# Patient Record
Sex: Male | Born: 1990 | Race: White | Hispanic: No | Marital: Single | Smoking: Never smoker
Health system: Southern US, Community
[De-identification: ages and names within clinical notes are randomized; demographics above are authoritative.]

## PROBLEM LIST (undated history)

## (undated) ENCOUNTER — Ambulatory Visit (HOSPITAL_COMMUNITY): Admission: EM | Discharge status: Absent without leave | Payer: 59

## (undated) DIAGNOSIS — R519 Headache, unspecified: Secondary | ICD-10-CM

## (undated) DIAGNOSIS — R51 Headache: Secondary | ICD-10-CM

---

## 2016-12-02 ENCOUNTER — Emergency Department (HOSPITAL_COMMUNITY): Payer: 59

## 2016-12-02 ENCOUNTER — Emergency Department (HOSPITAL_COMMUNITY)
Admission: EM | Admit: 2016-12-02 | Discharge: 2016-12-03 | Disposition: A | Discharge status: Home / Self Care | Payer: 59 | Attending: Emergency Medicine | Admitting: Emergency Medicine

## 2016-12-02 ENCOUNTER — Encounter (HOSPITAL_COMMUNITY): Payer: Self-pay

## 2016-12-02 DIAGNOSIS — G43B0 Ophthalmoplegic migraine, not intractable: Secondary | ICD-10-CM | POA: Diagnosis not present

## 2016-12-02 DIAGNOSIS — R51 Headache: Secondary | ICD-10-CM | POA: Diagnosis present

## 2016-12-02 LAB — I-STAT CHEM 8, ED
BUN: 9 mg/dL (ref 6–20)
Calcium, Ion: 1.18 mmol/L (ref 1.15–1.40)
Chloride: 106 mmol/L (ref 101–111)
Creatinine, Ser: 1 mg/dL (ref 0.61–1.24)
Glucose, Bld: 92 mg/dL (ref 65–99)
HCT: 38 % — ABNORMAL LOW (ref 39.0–52.0)
Hemoglobin: 12.9 g/dL — ABNORMAL LOW (ref 13.0–17.0)
Potassium: 4.2 mmol/L (ref 3.5–5.1)
Sodium: 140 mmol/L (ref 135–145)
TCO2: 23 mmol/L (ref 0–100)

## 2016-12-02 MED ORDER — DEXAMETHASONE SODIUM PHOSPHATE 10 MG/ML IJ SOLN
10.0000 mg | Freq: Once | INTRAMUSCULAR | Status: AC
  Administered 2016-12-02: 10 mg via INTRAVENOUS
  Filled 2016-12-02: qty 1

## 2016-12-02 MED ORDER — SODIUM CHLORIDE 0.9 % IV BOLUS (SEPSIS)
1000.0000 mL | Freq: Once | INTRAVENOUS | Status: AC
  Administered 2016-12-02: 1000 mL via INTRAVENOUS

## 2016-12-02 MED ORDER — KETOROLAC TROMETHAMINE 30 MG/ML IJ SOLN
30.0000 mg | Freq: Once | INTRAMUSCULAR | Status: AC
  Administered 2016-12-02: 30 mg via INTRAVENOUS
  Filled 2016-12-02: qty 1

## 2016-12-02 MED ORDER — DIPHENHYDRAMINE HCL 50 MG/ML IJ SOLN
25.0000 mg | Freq: Once | INTRAMUSCULAR | Status: AC
  Administered 2016-12-02: 25 mg via INTRAVENOUS
  Filled 2016-12-02: qty 1

## 2016-12-02 MED ORDER — METOCLOPRAMIDE HCL 5 MG/ML IJ SOLN
10.0000 mg | Freq: Once | INTRAMUSCULAR | Status: AC
  Administered 2016-12-02: 10 mg via INTRAVENOUS
  Filled 2016-12-02: qty 2

## 2016-12-02 NOTE — ED Triage Notes (Signed)
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 States developed headache this am with pain over left eye. Alert and oriented, NAD

## 2016-12-02 NOTE — ED Provider Notes (Signed)
[facility] DEPT Provider Note   CSN: [id number] Arrival date & time: 12/02/16  1859     History   Chief Complaint No chief complaint on file.   HPI [name] is a 26 y.o. male.  [name] is a 26 y.o. Male who presents to the ED complaining of a headache and loss of his left eye peripheral vision today. 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 He takes nortriptyline and topiramate for his headaches and has taken this today without relief. He reports only the peripheral vision in his left eye is affected. He denies eye pain. He reports generalized headache that is worse frontal. He does wear contacts. He denies head injury. He denies falls, fevers, neck pain, neck stiffness, numbness, tingling, weakness, dizziness, eye pain, chest pain, abdominal pain, nausea, vomiting or rashes.   The history is provided by the patient and medical records. No language interpreter was used.    History reviewed. No pertinent past medical history.  There are no active problems to display for this patient.   History reviewed. No pertinent surgical history.     Home Medications    Prior to Admission medications   Medication Sig Start Date End Date Taking? Authorizing Provider  nortriptyline (PAMELOR) 10 MG capsule Take 40-60 mg by mouth every morning. 40mg  daily until December 07, 2016 Then 50mg  daily until December 14, 2016 Then 60mg  daily from then on   Yes [provider]  ranitidine (ZANTAC) 150 MG tablet Take 150 mg by mouth daily as needed for heartburn.   Yes [provider]  TOPIRAMATE PO Take by mouth.   Yes [provider]    Family History No family history on file.  Social History Social History  Substance Use Topics  . Smoking status: Never Smoker  . Smokeless tobacco: Never Used  . Alcohol use Not on file     Allergies   Codeine   Review of  Systems Review of Systems  Constitutional: Negative for chills and fever.  HENT: Negative for congestion and sore throat.   Eyes: Positive for visual disturbance. Negative for photophobia, pain and redness.  Respiratory: Negative for cough and shortness of breath.   Cardiovascular: Negative for chest pain.  Gastrointestinal: Negative for abdominal pain, diarrhea, nausea and vomiting.  Genitourinary: Negative for dysuria.  Musculoskeletal: Negative for back pain, neck pain and neck stiffness.  Skin: Negative for rash.  Neurological: Positive for light-headedness and headaches. Negative for dizziness, syncope, weakness and numbness.     Physical Exam Updated Vital Signs BP 119/81   Pulse 63   Temp 99 F (37.2 C) (Oral)   Resp 18   SpO2 98%   Physical Exam  Constitutional: He is oriented to person, place, and time. He appears well-developed and well-nourished. No distress.  Nontoxic appearing.   HENT:  Head: Normocephalic and atraumatic.  Right Ear: External ear normal.  Left Ear: External ear normal.  Mouth/Throat: Oropharynx is clear and moist. No oropharyngeal exudate.  No temporal edema or TTP. Bilateral tympanic membranes are pearly-gray without erythema or loss of landmarks.   Eyes: Conjunctivae and EOM are normal. Pupils are equal, round, and reactive to light. Right eye exhibits no discharge. Left eye exhibits no discharge.  On vision exam patient's left peripheral vision is only affected. When closing his left eye patient can see easily out of his right eye. EOMs intact.   Neck: Normal  range of motion. Neck supple. No JVD present. No tracheal deviation present.  No meningeal signs.  Cardiovascular: Normal rate, regular rhythm, normal heart sounds and intact distal pulses.  Exam reveals no gallop and no friction rub.   No murmur heard. Pulmonary/Chest: Effort normal and breath sounds normal. No stridor. No respiratory distress. He has no wheezes. He has no rales.   Abdominal: Soft. There is no tenderness.  Musculoskeletal: Normal range of motion. He exhibits no edema or tenderness.  Lymphadenopathy:    He has no cervical adenopathy.  Neurological: He is alert and oriented to person, place, and time. No cranial nerve deficit or sensory deficit. He exhibits normal muscle tone. Coordination normal.  See eye exam above. Left eye peripheral vision is the only area that seems to be affected. Colonel nerves are otherwise intact. Speech is clear and coherent. He is alert and oriented 3. No pronator drift. Finger to nose intact bilaterally. EOMs are intact. Normal gait. Strength and sensation is intact in his bilateral upper and lower extremities.  Skin: Skin is warm and dry. Capillary refill takes less than 2 seconds. No rash noted. He is not diaphoretic. No erythema. No pallor.  Psychiatric: He has a normal mood and affect. His behavior is normal.  Nursing note and vitals reviewed.    ED Treatments / Results  Labs (all labs ordered are listed, but only abnormal results are displayed) Labs Reviewed  I-STAT CHEM 8, ED - Abnormal; Notable for the following:       Result Value   Hemoglobin 12.9 (*)    HCT 38.0 (*)    All other components within normal limits    EKG  EKG Interpretation None       Radiology Ct Head Wo Contrast  Result Date: 12/02/2016 CLINICAL DATA:  Chronic headaches related to pituitary abnormality EXAM: CT HEAD WITHOUT CONTRAST TECHNIQUE: TECHNIQUE Multidetector CT imaging of the head and cervical spine was performed following the standard protocol without intravenous contrast. Multiplanar CT image reconstructions of the cervical spine were also generated. COMPARISON:  None. FINDINGS: CT HEAD FINDINGS Brain: No acute territorial infarction, or hemorrhage is seen. 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 Vascular: No hyperdense vessels.  No unexpected calcification. Skull: No fracture or  suspicious bone lesion. Sinuses/Orbits: Minimal mucosal thickening in the ethmoid sinuses. No acute orbital abnormality is seen. Other: None IMPRESSION: 1. 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 MRI would better depict cavernous sinus involvement of the mass as well as the relationship of the mass to the optic nerve. Given history of headache and left-sided eye pain, MRI with contrast recommended for further evaluation of the pituitary mass. Electronically Signed   By: Donavan Foil M.D.   On: 12/02/2016 22:51    Procedures Procedures (including critical care time)  Medications Ordered in ED Medications  metoCLOPramide (REGLAN) injection 10 mg (10 mg Intravenous Given 12/02/16 2237)  diphenhydrAMINE (BENADRYL) injection 25 mg (25 mg Intravenous Given 12/02/16 2231)  sodium chloride 0.9 % bolus 1,000 mL (0 mLs Intravenous Stopped 12/03/16 0022)  dexamethasone (DECADRON) injection 10 mg (10 mg Intravenous Given 12/02/16 2234)  ketorolac (TORADOL) 30 MG/ML injection 30 mg (30 mg Intravenous Given 12/02/16 2236)     Initial Impression / Assessment and Plan / ED Course  I have reviewed the triage vital signs and the nursing notes.  Pertinent labs & imaging results that were  available during my care of the patient were reviewed by me and considered in my medical decision making (see chart for details).    This is a 27 y.o. Male who presents to the ED complaining of a headache and loss of his left eye peripheral vision today. 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 He takes nortriptyline and topiramate for his headaches and has taken this today without relief. He reports only the peripheral vision in his left eye is affected. He denies eye pain. He reports generalized headache that is worse frontal. He does wear contacts. On exam the patient is afebrile nontoxic appearing. On neurological  exam he has loss of the left peripheral field of vision. This is unilateral. He has no other focal neurological deficits. Provided migraine cocktail and obtain head CT. I discussed this patient with neurology who agrees with plan for head CT. Suspect possible migraine. At this does not improve with migraine cocktail, would likely obtain an MRI. 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 At reevaluation patient reports his headache is improving, however his vision has still not improved. Will obtain MRI with contrast and MRV after discussion with radiology.  Later, while awaiting MRI, patient reports that his peripheral vision has returned. He reports his vision is completely back to normal and his headache is resolved. Suspect ocular migraine. Will discharge with close follow-up by [facility] neurology. Strict and specific return precautions were discussed. I advised the patient to follow-up with their primary care provider this week. I advised the patient to return to the emergency department with new or worsening symptoms or new concerns. The patient verbalized understanding and agreement with plan.    This patient was discussed with and evaluated by Dr. Betsey Holiday who agrees with assessment and plan.   Final Clinical Impressions(s) / ED Diagnoses   Final diagnoses:  Ophthalmoplegic migraine, not intractable    New Prescriptions Discharge Medication List as of 12/03/2016 12:22 AM       Waynetta Pean, PA-C 12/03/16 0042    Orpah Greek, MD 12/03/16 [phone]

## 2016-12-03 NOTE — ED Notes (Signed)
Patient able to ambulate independently  

## 2016-12-03 NOTE — ED Notes (Signed)
Patient asking to "skip the MRI" and follow up with a neurologist.  Vision reassessed, patient states vision improved in left periphery.  MD aware.

## 2017-08-07 ENCOUNTER — Emergency Department (HOSPITAL_COMMUNITY): Payer: 59

## 2017-08-07 ENCOUNTER — Inpatient Hospital Stay (HOSPITAL_COMMUNITY)
Admission: EM | Admit: 2017-08-07 | Discharge: 2017-08-12 | DRG: 615 | Disposition: A | Discharge status: Home / Self Care | Payer: 59 | Attending: Neurosurgery | Admitting: Neurosurgery

## 2017-08-07 ENCOUNTER — Encounter (HOSPITAL_COMMUNITY): Payer: Self-pay

## 2017-08-07 ENCOUNTER — Other Ambulatory Visit: Payer: Self-pay

## 2017-08-07 DIAGNOSIS — R739 Hyperglycemia, unspecified: Secondary | ICD-10-CM | POA: Diagnosis present

## 2017-08-07 DIAGNOSIS — I729 Aneurysm of unspecified site: Secondary | ICD-10-CM | POA: Diagnosis present

## 2017-08-07 DIAGNOSIS — G253 Myoclonus: Secondary | ICD-10-CM | POA: Diagnosis not present

## 2017-08-07 DIAGNOSIS — Z885 Allergy status to narcotic agent status: Secondary | ICD-10-CM | POA: Diagnosis not present

## 2017-08-07 DIAGNOSIS — G43909 Migraine, unspecified, not intractable, without status migrainosus: Secondary | ICD-10-CM | POA: Diagnosis present

## 2017-08-07 DIAGNOSIS — H53462 Homonymous bilateral field defects, left side: Secondary | ICD-10-CM | POA: Diagnosis present

## 2017-08-07 DIAGNOSIS — Z419 Encounter for procedure for purposes other than remedying health state, unspecified: Secondary | ICD-10-CM

## 2017-08-07 DIAGNOSIS — E237 Disorder of pituitary gland, unspecified: Secondary | ICD-10-CM

## 2017-08-07 DIAGNOSIS — I671 Cerebral aneurysm, nonruptured: Secondary | ICD-10-CM | POA: Diagnosis not present

## 2017-08-07 DIAGNOSIS — H5347 Heteronymous bilateral field defects: Secondary | ICD-10-CM

## 2017-08-07 DIAGNOSIS — R7989 Other specified abnormal findings of blood chemistry: Secondary | ICD-10-CM

## 2017-08-07 DIAGNOSIS — D497 Neoplasm of unspecified behavior of endocrine glands and other parts of nervous system: Secondary | ICD-10-CM

## 2017-08-07 DIAGNOSIS — D352 Benign neoplasm of pituitary gland: Secondary | ICD-10-CM | POA: Diagnosis present

## 2017-08-07 DIAGNOSIS — G43809 Other migraine, not intractable, without status migrainosus: Secondary | ICD-10-CM | POA: Diagnosis not present

## 2017-08-07 HISTORY — DX: Headache, unspecified: R51.9

## 2017-08-07 HISTORY — DX: Headache: R51

## 2017-08-07 LAB — BASIC METABOLIC PANEL
Anion gap: 12 (ref 5–15)
BUN: 8 mg/dL (ref 6–20)
CO2: 19 mmol/L — ABNORMAL LOW (ref 22–32)
Calcium: 9.4 mg/dL (ref 8.9–10.3)
Chloride: 106 mmol/L (ref 101–111)
Creatinine, Ser: 0.96 mg/dL (ref 0.61–1.24)
GFR calc Af Amer: >60 mL/min (ref >60)
GFR calc non Af Amer: >60 mL/min (ref >60)
Glucose, Bld: 191 mg/dL — ABNORMAL HIGH (ref 65–99)
Potassium: 4 mmol/L (ref 3.5–5.1)
Sodium: 137 mmol/L (ref 135–145)

## 2017-08-07 LAB — CBC WITH DIFFERENTIAL/PLATELET
Basophils Absolute: 0 10*3/uL (ref 0.0–0.1)
Basophils Relative: 0 %
Eosinophils Absolute: 0 10*3/uL (ref 0.0–0.7)
Eosinophils Relative: 0 %
HCT: 45.1 % (ref 39.0–52.0)
Hemoglobin: 15.4 g/dL (ref 13.0–17.0)
Lymphocytes Relative: 6 %
Lymphs Abs: 0.6 10*3/uL — ABNORMAL LOW (ref 0.7–4.0)
MCH: 29.5 pg (ref 26.0–34.0)
MCHC: 34.1 g/dL (ref 30.0–36.0)
MCV: 86.4 fL (ref 78.0–100.0)
Monocytes Absolute: 0.1 10*3/uL (ref 0.1–1.0)
Monocytes Relative: 1 %
Neutro Abs: 9.3 10*3/uL — ABNORMAL HIGH (ref 1.7–7.7)
Neutrophils Relative %: 93 %
Platelets: 194 10*3/uL (ref 150–400)
RBC: 5.22 MIL/uL (ref 4.22–5.81)
RDW: 12.5 % (ref 11.5–15.5)
WBC: 10 10*3/uL (ref 4.0–10.5)

## 2017-08-07 MED ORDER — DEXAMETHASONE SODIUM PHOSPHATE 10 MG/ML IJ SOLN
10.0000 mg | Freq: Once | INTRAMUSCULAR | Status: AC
  Administered 2017-08-07: 10 mg via INTRAVENOUS
  Filled 2017-08-07: qty 1

## 2017-08-07 MED ORDER — GADOBENATE DIMEGLUMINE 529 MG/ML IV SOLN
15.0000 mL | Freq: Once | INTRAVENOUS | Status: AC | PRN
  Administered 2017-08-07: 15 mL via INTRAVENOUS

## 2017-08-07 MED ORDER — DEXAMETHASONE SODIUM PHOSPHATE 10 MG/ML IJ SOLN
4.0000 mg | Freq: Three times a day (TID) | INTRAMUSCULAR | Status: DC
  Administered 2017-08-07 – 2017-08-08 (×3): 4 mg via INTRAVENOUS
  Filled 2017-08-07 (×3): qty 1

## 2017-08-07 MED ORDER — PROMETHAZINE HCL 25 MG/ML IJ SOLN
25.0000 mg | Freq: Once | INTRAMUSCULAR | Status: AC
  Administered 2017-08-07: 25 mg via INTRAVENOUS
  Filled 2017-08-07: qty 1

## 2017-08-07 MED ORDER — SODIUM CHLORIDE 0.9 % IV BOLUS (SEPSIS)
1000.0000 mL | Freq: Once | INTRAVENOUS | Status: AC
  Administered 2017-08-07: 1000 mL via INTRAVENOUS

## 2017-08-07 MED ORDER — KETOROLAC TROMETHAMINE 30 MG/ML IJ SOLN
30.0000 mg | Freq: Once | INTRAMUSCULAR | Status: AC
  Administered 2017-08-07: 30 mg via INTRAVENOUS
  Filled 2017-08-07: qty 1

## 2017-08-07 MED ORDER — METOCLOPRAMIDE HCL 5 MG/ML IJ SOLN
10.0000 mg | Freq: Once | INTRAMUSCULAR | Status: AC
  Administered 2017-08-07: 10 mg via INTRAVENOUS
  Filled 2017-08-07: qty 2

## 2017-08-07 NOTE — H&P (Signed)
[name] ZOX:[id number] DOB: 1991/03/17 DOA: 08/07/2017     PCP: none local Outpatient Specialists: none  Patient coming from:    home Lives alone,       Chief Complaint: Sudden visual field deficit  HPI: [name] is a 27 y.o. male with medical history significant of pituitary mass migraine headaches    Presented with sudden visual loss patient developed sudden onset around 6 AM of visual field loss he reportedly has known history of pituitary tumor but has not followed up with neurology. He states for the past 7 years he have had head ache behind left eye. Was told that he has pituitary tumor never had MRA. At the time it was only 71mm and he chose not to undergo resection.  Patient was told he has  migraines and had similar episode in June 2018 for which she received migraine cocktail some improvement reports associated left-sided headache tat is persistent not worse with activity or bending down not worse with cought. She reports complete loss of vision and temporal sides of his vision bilaterally. No LOC no trauma to the head no nausea vomiting Admission initially from [place] neurologist here   While in ER: His migraine pain was treated but did not seem to resolve.    ER provider discussed case with: [facility] Who recommends: MRI, MR pituitary, after a CT head  Significant initial  Findings: CT of the head showed stable pituitary mass is out acute changes. MRI/MRA was worrisome for aneurysm measuring 12 mm  arising from Covernous segment of left internal carotid artery WBC 10.0 otherwise unremarkable Given this findings  ER provider discussed case with:  Dr. Estanislado Pandy (Neurosurgery)  Who recommends: admitting patient to obtain a diagnostic Angiogram tomorrow morning nothing by mouth post night midnight in case he may need intervention.  Initiate Decadron taper with 10 mg now and 4 every 8 hour.   We'll see patient in consult in the morning      Neurology Dr. Jackelyn Knife see patient in consult in ED  IN ER:  Temp (24hrs), Avg:98.8 F (37.1 C), Min:98.8 F (37.1 C), Max:98.8 F (37.1 C)      on arrival  ED Triage Vitals  Enc Vitals Group     BP 08/07/17 1338 127/72     Pulse Rate 08/07/17 1338 60     Resp 08/07/17 1338 18     Temp 08/07/17 1338 98.8 F (37.1 C)     Temp Source 08/07/17 1338 Oral     SpO2 08/07/17 1338 100 %     Weight 08/07/17 1343 160 lb (72.6 kg)     Height --      Head Circumference --      Peak Flow --      Pain Score 08/07/17 1343 10     Pain Loc --      Pain Edu? --      Excl. in [place]? --     Latest satting 98% HR 62 BP 114/71  Following Medications were ordered in ER: Medications  dexamethasone (DECADRON) injection 4 mg (not administered)  sodium chloride 0.9 % bolus 1,000 mL (0 mLs Intravenous Stopped 08/07/17 1646)  ketorolac (TORADOL) 30 MG/ML injection 30 mg (30 mg Intravenous Given 08/07/17 1532)  metoCLOPramide (REGLAN) injection 10 mg (10 mg Intravenous Given 08/07/17 1532)  dexamethasone (DECADRON) injection 10 mg (10 mg Intravenous Given 08/07/17 1532)  promethazine (PHENERGAN) injection 25 mg (25 mg Intravenous Given 08/07/17 1833)  gadobenate dimeglumine (MULTIHANCE)  injection 15 mL (15 mLs Intravenous Contrast Given 08/07/17 1815)  dexamethasone (DECADRON) injection 10 mg (10 mg Intravenous Given 08/07/17 2005)     Hospitalist was called for admission for medical admission for 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     Review of Systems:    Pertinent positives include: Headache visual loss  Constitutional:  No weight loss, night sweats, Fevers, chills, fatigue, weight loss  HEENT:    Difficulty swallowing,Tooth/dental problems,Sore throat,  No sneezing, itching, ear ache, nasal congestion, post nasal drip,  Cardio-vascular:  No chest pain, Orthopnea, PND, anasarca, dizziness, palpitations.no Bilateral lower extremity  swelling  GI:  No heartburn, indigestion, abdominal pain, nausea, vomiting, diarrhea, change in bowel habits, loss of appetite, melena, blood in stool, hematemesis Resp:  no shortness of breath at rest. No dyspnea on exertion, No excess mucus, no productive cough, No non-productive cough, No coughing up of blood.No change in color of mucus.No wheezing. Skin:  no rash or lesions. No jaundice GU:  no dysuria, change in color of urine, no urgency or frequency. No straining to urinate.  No flank pain.  Musculoskeletal:  No joint pain or no joint swelling. No decreased range of motion. No back pain.  Psych:  No change in mood or affect. No depression or anxiety. No memory loss.  Neuro: no localizing neurological complaints, no tingling, no weakness, no double vision, no gait abnormality, no slurred speech, no confusion  As per HPI otherwise 10 point review of systems negative.   Past Medical History: History reviewed. No pertinent past medical history. History reviewed. No pertinent surgical history.   Social History:  Ambulatory  independently     reports that  has never smoked. he has never used smokeless tobacco. His alcohol and drug histories are not on file.  Allergies:   Allergies  Allergen Reactions  . Codeine Nausea And Vomiting       Family History:   Family History  Problem Relation Age of Onset  . Diabetes Mellitus I Sister   . CAD Other     Medications: Prior to Admission medications   Medication Sig Start Date End Date Taking? Authorizing Provider  nortriptyline (PAMELOR) 10 MG capsule Take 40-60 mg by mouth every morning. 40mg  daily until December 07, 2016 Then 50mg  daily until December 14, 2016 Then 60mg  daily from then on    [provider]  ranitidine (ZANTAC) 150 MG tablet Take 150 mg by mouth daily as needed for heartburn.    [provider]  TOPIRAMATE PO Take by mouth.    [provider]    Physical Exam: Patient Vitals for  the past 24 hrs:  BP Temp Temp src Pulse Resp SpO2 Weight  08/07/17 1600 114/71 - - 62 - 98 % -  08/07/17 1517 - - - 62 - 100 % -  08/07/17 1516 123/74 - - - - - -  08/07/17 1343 - - - - - - 72.6 kg (160 lb)  08/07/17 1338 127/72 98.8 F (37.1 C) Oral 60 18 100 % -    1. General:  in No Acute distress  well  -appearing 2. Psychological: Alert and   Oriented 3. Head/ENT:     Dry Mucous Membranes                          Head Non traumatic, neck supple  Normal  Dentition 4. SKIN:  decreased Skin turgor,  Skin clean Dry and intact no rash 5. Heart: Regular rate and rhythm no  Murmur, no Rub or gallop 6. Lungs:  Clear to auscultation bilaterally, no wheezes or crackles   7. Abdomen: Soft,   non-tender, Non distended bowel sounds present 8. Lower extremities: no clubbing, cyanosis, or edema 9. Neurologically   strength 5 out of 5 in all 4 extremities cranial nerves II through XII intact 10. MSK: Normal range of motion   body mass index is unknown because there is no height or weight on file.  Labs on Admission:   Labs on Admission: I have personally reviewed following labs and imaging studies  CBC: Recent Labs  Lab 08/07/17 2042  WBC 10.0  NEUTROABS 9.3*  HGB 15.4  HCT 45.1  MCV 86.4  PLT 443   Basic Metabolic Panel: No results for input(s): NA, K, CL, CO2, GLUCOSE, BUN, CREATININE, CALCIUM, MG, PHOS in the last 168 hours. GFR: CrCl cannot be calculated (Patient's most recent lab result is older than the maximum 21 days allowed.). Liver Function Tests: No results for input(s): AST, ALT, ALKPHOS, BILITOT, PROT, ALBUMIN in the last 168 hours. No results for input(s): LIPASE, AMYLASE in the last 168 hours. No results for input(s): AMMONIA in the last 168 hours. Coagulation Profile: No results for input(s): INR, PROTIME in the last 168 hours. Cardiac Enzymes: No results for input(s): CKTOTAL, CKMB, CKMBINDEX, TROPONINI in the last 168 hours. BNP  (last 3 results) No results for input(s): PROBNP in the last 8760 hours. HbA1C: No results for input(s): HGBA1C in the last 72 hours. CBG: No results for input(s): GLUCAP in the last 168 hours. Lipid Profile: No results for input(s): CHOL, HDL, LDLCALC, TRIG, CHOLHDL, LDLDIRECT in the last 72 hours. Thyroid Function Tests: No results for input(s): TSH, T4TOTAL, FREET4, T3FREE, THYROIDAB in the last 72 hours. Anemia Panel: No results for input(s): VITAMINB12, FOLATE, FERRITIN, TIBC, IRON, RETICCTPCT in the last 72 hours. Urine analysis: No results found for: COLORURINE, APPEARANCEUR, LABSPEC, PHURINE, GLUCOSEU, HGBUR, BILIRUBINUR, KETONESUR, PROTEINUR, UROBILINOGEN, NITRITE, LEUKOCYTESUR Sepsis Labs: @LABRCNTIP (procalcitonin:4,lacticidven:4) )No results found for this or any previous visit (from the past 240 hour(s)).     UA not ordered  No results found for: HGBA1C  CrCl cannot be calculated (Patient's most recent lab result is older than the maximum 21 days allowed.).  BNP (last 3 results) No results for input(s): PROBNP in the last 8760 hours.   ECG REPORT Not obtained  Filed Weights   08/07/17 1343  Weight: 72.6 kg (160 lb)     Cultures: No results found for: SDES, SPECREQUEST, CULT, REPTSTATUS   Radiological Exams on Admission: Ct Head Wo Contrast  Result Date: 08/07/2017 CLINICAL DATA:  Headache for 3 hours. EXAM: CT HEAD WITHOUT CONTRAST TECHNIQUE: Contiguous axial images were obtained from the base of the skull through the vertex without intravenous contrast. COMPARISON:  12/02/2016 FINDINGS: Brain: The ventricles are normal in size and configuration. No extra-axial fluid collections are identified. The gray-white differentiation is maintained. No CT findings for acute hemispheric infarction or intracranial hemorrhage. There is a stable pituitary lesion on the left side measuring a maximum of 12 mm. No change when compared to the prior CT scan. The brainstem and  cerebellum are normal. Vascular: No hyperdense vessels or obvious aneurysm. Skull: No acute skull fracture.  No bone lesion. Sinuses/Orbits: The paranasal sinuses and mastoid air cells are clear. The globes are intact. Other: No scalp  lesions, laceration or hematoma. 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 No new/acute intracranial findings. Electronically Signed   By: Marijo Sanes M.D.   On: 08/07/2017 17:16   Mr Angiogram Head Wo Contrast  Result Date: 08/07/2017 CLINICAL DATA:  Headaches with reported bitemporal visual loss. Previous history of severe headaches. EXAM: MRI HEAD WITHOUT AND WITH CONTRAST MRA HEAD WITHOUT CONTRAST TECHNIQUE: Multiplanar, multiecho pulse sequences of the brain and surrounding structures were obtained without and with intravenous contrast. Angiographic images of the head were obtained using MRA technique without contrast. CONTRAST:  73mL MULTIHANCE GADOBENATE DIMEGLUMINE 529 MG/ML IV SOLN COMPARISON:  CT head 08/07/2017.  CT head 12/02/2016. FINDINGS: MRI HEAD FINDINGS Brain: No acute stroke, acute hemorrhage, intra-axial mass lesion, hydrocephalus, or extra-axial fluid. Normal for age cerebral volume. No white matter disease. 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 There is slight heterogeneity of signal within the lesion on T1 and FLAIR imaging, consistent with swirling blood. The lesion is roughly spherical, 12 mm diameter. The lesion does not enhance in conjunction with pituitary. Pituitary stalk is deviated LEFT-to-RIGHT. The lesion is outside the pituitary gland, but does accumulate contrast on post infusion imaging. The lesion does demonstrate mild mass effect on the pre chiasmatic intracranial optic nerves but the chiasm appears normally located, and surrounded by CSF. Vascular: Carotid, basilar, vertebral flow voids are preserved. The intrasellar structure  appears to have an arterial signature, with flow void similar to the carotid artery. Skull and upper cervical spine: Normal marrow signal. Sinuses/Orbits: Negative. Other: Compared with prior CT, similar appearance. MRA HEAD FINDINGS The internal carotid arteries are widely patent. The basilar artery is widely patent with vertebrals codominant. There is no intracranial stenosis. There is T1 shortening associated with abnormal flow related enhancement of an intrasellar 12 mm aneurysm arising from the superior cavernous segment of the LEFT internal carotid artery. There appears to have a narrow neck based on examination is series 12, image 60 axial source image. 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 The pituitary gland is displaced LEFT-to-RIGHT, in the stalk is stretched over the dome of the aneurysm. There is no intrinsic pituitary tumor. Mild mass effect on the pre chiasmatic intracranial optic nerves, but the chiasm appears normally located. Neuro-Interventional Radiology consultation is suggested to evaluate the appropriateness of potential treatment. Non-emergent evaluation can be arranged by calling [phone] during usual hours. Emergency evaluation can be requested by paging [phone]. Electronically Signed   By: Staci Righter M.D.   On: 08/07/2017 18:50   Mr Jeri Cos And Wo Contrast  Result Date: 08/07/2017 CLINICAL DATA:  Headaches with reported bitemporal visual loss. Previous history of severe headaches. EXAM: MRI HEAD WITHOUT AND WITH CONTRAST MRA HEAD WITHOUT CONTRAST TECHNIQUE: Multiplanar, multiecho pulse sequences of the brain and surrounding structures were obtained without and with intravenous contrast. Angiographic images of the head were obtained using MRA technique without contrast. CONTRAST:  24mL MULTIHANCE GADOBENATE DIMEGLUMINE 529 MG/ML IV SOLN COMPARISON:  CT head 08/07/2017.  CT head 12/02/2016.  FINDINGS: MRI HEAD FINDINGS Brain: No acute stroke, acute hemorrhage, intra-axial mass lesion, hydrocephalus, or extra-axial fluid. Normal for age cerebral volume. No white matter disease. 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 There is slight heterogeneity of signal  within the lesion on T1 and FLAIR imaging, consistent with swirling blood. The lesion is roughly spherical, 12 mm diameter. The lesion does not enhance in conjunction with pituitary. Pituitary stalk is deviated LEFT-to-RIGHT. The lesion is outside the pituitary gland, but does accumulate contrast on post infusion imaging. The lesion does demonstrate mild mass effect on the pre chiasmatic intracranial optic nerves but the chiasm appears normally located, and surrounded by CSF. Vascular: Carotid, basilar, vertebral flow voids are preserved. The intrasellar structure appears to have an arterial signature, with flow void similar to the carotid artery. Skull and upper cervical spine: Normal marrow signal. Sinuses/Orbits: Negative. Other: Compared with prior CT, similar appearance. MRA HEAD FINDINGS The internal carotid arteries are widely patent. The basilar artery is widely patent with vertebrals codominant. There is no intracranial stenosis. There is T1 shortening associated with abnormal flow related enhancement of an intrasellar 12 mm aneurysm arising from the superior cavernous segment of the LEFT internal carotid artery. There appears to have a narrow neck based on examination is series 12, image 60 axial source image. 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 The pituitary gland is displaced LEFT-to-RIGHT, in the stalk is stretched over the dome of the aneurysm. There is no intrinsic pituitary tumor. Mild mass effect on the pre chiasmatic intracranial optic nerves, but the chiasm  appears normally located. Neuro-Interventional Radiology consultation is suggested to evaluate the appropriateness of potential treatment. Non-emergent evaluation can be arranged by calling [phone] during usual hours. Emergency evaluation can be requested by paging [phone]. Electronically Signed   By: Staci Righter M.D.   On: 08/07/2017 18:50    Chart has been reviewed    Assessment/Plan  27 y.o. male with medical history significant of pituitary mass migraine headaches  Admitted for 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  Present on Admission: . 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  Plan for angiogram in a.m. and then probable repair acute temporal  visual loss - likely due to optic chiasm impingement, possible mild monitor neuro checks every 2 hours and stepdown if there is any acute change she will need emergent intervention  Other plan as per orders.  DVT prophylaxis:  SCD   Code Status:  FULL CODE  as per patient    Family Communication:   Family not at  Bedside     Disposition Plan:    To home once workup is complete and patient is stable       Consults called: Neurology, neurosurgery    Admission status:  inpatient      Level of care        SDU      I have spent a total of 56 min on this admission   Aleasha Fregeau 08/07/2017, 9:56 PM    [facility]  Pager [phone]   after 2 AM please page floor coverage PA If 7AM-7PM, please contact the day team taking care of the patient  [email or web address]  Password [email or web address]

## 2017-08-07 NOTE — ED Provider Notes (Cosign Needed)
[facility] EMERGENCY DEPARTMENT Provider Note   CSN: [id number] Arrival date & time: 08/07/17  1233     History   Chief Complaint Chief Complaint  Patient presents with  . Migraine    HPI [name] is a 27 y.o. male who presents today for evaluation of sudden onset visual field deficits.  He reports that it began approximately 3 hours ago.  He has a known pituitary tumor and has not been following up with neurology recently.  He reports that he had similar symptoms in June 2018 that resolved after being treated with a migraine cocktail, however he reports that this is significantly different in that it feels worse and is more painful.  He reports this is mostly on the left side of his head.   He reports that that pain felt different than this one does.  He denies trauma other than his dog playfully hitting him in the face this morning.  He denies blood thinners.  He denies nausea or vomiting.    His visual field cut is the temporal sides of his vision and he reports it is equal bilaterally.  His vision is fully gone in the affected area.    HPI  History reviewed. No pertinent past medical history.  There are no active problems to display for this patient.   History reviewed. No pertinent surgical history.     Home Medications    Prior to Admission medications   Medication Sig Start Date End Date Taking? Authorizing Provider  nortriptyline (PAMELOR) 10 MG capsule Take 40-60 mg by mouth every morning. 40mg  daily until December 07, 2016 Then 50mg  daily until December 14, 2016 Then 60mg  daily from then on    [provider]  ranitidine (ZANTAC) 150 MG tablet Take 150 mg by mouth daily as needed for heartburn.    [provider]  TOPIRAMATE PO Take by mouth.    [provider]    Family History No family history on file.  Social History Social History   Tobacco Use  . Smoking status: Never Smoker  . Smokeless tobacco: Never  Used  Substance Use Topics  . Alcohol use: Not on file  . Drug use: Not on file     Allergies   Codeine   Review of Systems Review of Systems  Constitutional: Negative for chills and fever.  HENT: Negative for ear pain and sore throat.   Eyes: Positive for visual disturbance. Negative for pain.  Respiratory: Negative for cough and shortness of breath.   Cardiovascular: Negative for chest pain and palpitations.  Gastrointestinal: Negative for abdominal pain and vomiting.  Genitourinary: Negative for dysuria and hematuria.  Musculoskeletal: Negative for arthralgias and back pain.  Skin: Negative for color change and rash.  Neurological: Positive for headaches. Negative for dizziness, seizures and syncope.  All other systems reviewed and are negative.    Physical Exam Updated Vital Signs BP 114/71   Pulse 62   Temp 98.8 F (37.1 C) (Oral)   Resp 18   Wt 72.6 kg (160 lb)   SpO2 98%   Physical Exam  Constitutional: He is oriented to person, place, and time. He appears well-developed and well-nourished.  HENT:  Head: Normocephalic and atraumatic.  Eyes: Conjunctivae and EOM are normal. Pupils are equal, round, and reactive to light.  Patient is unable to see with his peripheral vision in bilateral eyes.    Neck: Neck supple.  Cardiovascular: Normal rate and regular rhythm.  No murmur  heard. Pulmonary/Chest: Effort normal and breath sounds normal. No respiratory distress.  Abdominal: Soft. There is no tenderness.  Musculoskeletal: He exhibits no edema.  Neurological: He is alert and oriented to person, place, and time. GCS eye subscore is 4. GCS verbal subscore is 5. GCS motor subscore is 6.  Mental Status:  Alert, oriented, thought content appropriate, able to give a coherent history. Speech fluent without evidence of aphasia. Able to follow 2 step commands without difficulty.  Cranial Nerves:  II:  Patient is missing the vision in his temporal side vision.   pupils  equal, round, reactive to light.   III,IV, VI: ptosis not present, extra-ocular motions intact bilaterally  V,VII: smile symmetric, facial light touch sensation equal VIII: hearing grossly normal to voice  X: uvula elevates symmetrically  XI: bilateral shoulder shrug symmetric and strong XII: midline tongue extension without fassiculations Motor:  Normal tone. 5/5 in upper and lower extremities bilaterally including strong and equal grip strength and dorsiflexion/plantar flexion Cerebellar: normal finger-to-nose with bilateral upper extremities Gait: normal gait and balance CV: distal pulses palpable throughout    Skin: Skin is warm and dry.  Psychiatric: He has a normal mood and affect.  Nursing note and vitals reviewed.    ED Treatments / Results  Labs (all labs ordered are listed, but only abnormal results are displayed) Labs Reviewed  BASIC METABOLIC PANEL  CBC    EKG  EKG Interpretation None       Radiology Ct Head Wo Contrast  Result Date: 08/07/2017 CLINICAL DATA:  Headache for 3 hours. EXAM: CT HEAD WITHOUT CONTRAST TECHNIQUE: Contiguous axial images were obtained from the base of the skull through the vertex without intravenous contrast. COMPARISON:  12/02/2016 FINDINGS: Brain: The ventricles are normal in size and configuration. No extra-axial fluid collections are identified. The gray-white differentiation is maintained. No CT findings for acute hemispheric infarction or intracranial hemorrhage. There is a stable pituitary lesion on the left side measuring a maximum of 12 mm. No change when compared to the prior CT scan. The brainstem and cerebellum are normal. Vascular: No hyperdense vessels or obvious aneurysm. Skull: No acute skull fracture.  No bone lesion. Sinuses/Orbits: The paranasal sinuses and mastoid air cells are clear. The globes are intact. Other: No scalp lesions, laceration or hematoma. 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 No  new/acute intracranial findings. Electronically Signed   By: Marijo Sanes M.D.   On: 08/07/2017 17:16    Procedures Procedures (including critical care time)  Medications Ordered in ED Medications  promethazine (PHENERGAN) injection 25 mg (not administered)  sodium chloride 0.9 % bolus 1,000 mL (0 mLs Intravenous Stopped 08/07/17 1646)  ketorolac (TORADOL) 30 MG/ML injection 30 mg (30 mg Intravenous Given 08/07/17 1532)  metoCLOPramide (REGLAN) injection 10 mg (10 mg Intravenous Given 08/07/17 1532)  dexamethasone (DECADRON) injection 10 mg (10 mg Intravenous Given 08/07/17 1532)     Initial Impression / Assessment and Plan / ED Course  I have reviewed the triage vital signs and the nursing notes.  Pertinent labs & imaging results that were available during my care of the patient were reviewed by me and considered in my medical decision making (see chart for details).  Clinical Course as of Aug 07 1730  Wed Aug 07, 2017  1549 Paged Dr. Cheral Marker neurology  [EH]  [id number] year old male recently moved here from [place] complaining of worsening of his chronic headache now associated with some visual field cuts.  Patient has  a history of a pituitary tumor, but has actually had blood in the past.  He is here with this visual symptoms that he has had in the past.  He has no numbness or weakness.  Neurology was consulted and they are recommending MRI, MR pituitary, after a CT head.  [MB]    Clinical Course User Index [EH] Lorin Glass, PA-C [MB] Hayden Rasmussen, MD   Tamala Fothergill presents today for evaluation of sudden onset of loss of temporal side vision with headache.  He has a known mass on his pituitary has had this happen once before and it resolved with migraine cocktail.  On neuro exam he is unable to see objects in his temporal visual fields, otherwise is neuro intact.  He was treated with fluids, Toradol, Benadryl, and Reglan which brought his pain from a 10 to a 9.   Orders given for IV Phenergan.  CT head obtained, showing stable pituitary mass without acute deficits.  MR brain with contrast and pituitary was ordered.  At shift change care was transferred to [facility] who will follow pending studies, re-evaulate and determine disposition.     Final Clinical Impressions(s) / ED Diagnoses   Final diagnoses:  None    ED Discharge Orders    None       Lorin Glass, [place] 08/07/17 1732

## 2017-08-07 NOTE — ED Provider Notes (Signed)
  Patient placed in Quick Look pathway, seen and evaluated for chief complaint of headache starting today. He reports loss of peripheral vision. This is the same symptoms he had last June which resolved with headache cocktail. He has not followed up with neurology. .  Pertinent H&P findings include Speech is clear and coherent, non-toxic appearing, no respiratory distress.  A&O x 3.   Based on initial evaluation, labs are not indicated and radiology studies are not indicated.  Patient counseled on process, plan, and necessity for staying for completing the evaluation.  Vitals:   08/07/17 1338 08/07/17 1343  BP: 127/72   Pulse: 60   Resp: 18   Temp: 98.8 F (37.1 C)   TempSrc: Oral   SpO2: 100%   Weight:  72.6 kg (160 lb)       Waynetta Pean, PA-C 08/07/17 1346    Lajean Saver, MD 08/07/17 1438

## 2017-08-07 NOTE — ED Notes (Signed)
No decrease in Pt peripheral field of vision.

## 2017-08-07 NOTE — ED Triage Notes (Signed)
Pt presents to the ed with complaints of headache that has been going on for 3 hours. States that he has lost his peripheral vision when it got really bad today. Pt has no focal neuro deficits, alert oriented and ambulatory. PA Will at bedside.

## 2017-08-07 NOTE — Consult Note (Signed)
Neurology Consultation Reason for Consult: Headache, aneurysm  Referring Physician: Roel Cluck   History is obtained from:Patient   HPI: [name] is a 27 y.o. male past medical history of headaches thought to be migraine, pituitary mass into 7 years presents with sudden vision loss and headache.  Patient states that he was diagnosed about 7 years ago in [place] to have had a pituitary mass, however was told if 7 mm and did not have it resected. He has not  followed up with a neurologist after moving to [facility] about a year ago. He has constant retro-orbital headaches which was diagnosed as migraine currently on no medications. Previously on amitriptyline. He also has had visual symptoms in the left eye for a while, but however today around 11 00 he noticed his vision was worse as well as he could not see from his right eye. 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 The patient received migraine cocktail and Decadron in the ER after consultation with interventional  neuroradiology is admitted for diagnostic angiogram. Neurology was consulted for recommendations.   ROS: A 14 point ROS was performed and is negative except as noted in the HPI  History reviewed. No pertinent past medical history.   Family History  Problem Relation Age of Onset  . Diabetes Mellitus I Sister   . CAD Other      Social History:  reports that  has never smoked. he has never used smokeless tobacco. He reports that he drinks alcohol. His drug history is not on file.   Exam: Current vital signs: BP 115/72   Pulse 68   Temp 98.8 F (37.1 C) (Oral)   Resp 20   Wt 72.6 kg (160 lb)   SpO2 97%  Vital signs in last 24 hours: Temp:  [98.8 F (37.1 C)] 98.8 F (37.1 C) (02/13 1338) Pulse Rate:  [60-68] 68 (02/13 2218) Resp:  [18-20]  20 (02/13 2218) BP: (114-127)/(71-74) 115/72 (02/13 2218) SpO2:  [97 %-100 %] 97 % (02/13 2218) Weight:  [72.6 kg (160 lb)] 72.6 kg (160 lb) (02/13 1343)   Physical Exam  Constitutional: Appears well-developed and well-nourished.  Psych: Affect appropriate to situation Eyes: No scleral injection HENT: No OP obstrucion Head: Normocephalic.  Cardiovascular: Normal rate and regular rhythm.  Respiratory: Effort normal, non-labored breathing GI: Soft.  No distension. There is no tenderness.  Skin: WDI  Neuro: Mental Status: Patient is awake, alert, oriented to person, place, month, year, and situation. Patient is able to give a clear and coherent history. No signs of aphasia or neglect Cranial Nerves: II: Visual Fields: Left Homonymous hemianopsia. Pupils are equal, round, and reactive to light.   III,IV, VI: EOMI without ptosis or diploplia.  V: Facial sensation is symmetric to temperature VII: Facial movement is symmetric.  VIII: hearing is intact to voice X: Uvula elevates symmetrically XI: Shoulder shrug is symmetric. XII: tongue is midline without atrophy or fasciculations.  Motor: Tone is normal. Bulk is normal. 5/5 strength was present in all four extremities.  Sensory: Sensation is symmetric to light touch and temperature in the arms and legs. Deep Tendon Reflexes: 2+ and symmetric in the biceps and patellae.  Plantars: Toes are downgoing bilaterally.  Cerebellar: FNF and HKS are intact bilaterally   I have reviewed labs in epic and the results pertinent to this  consultation are: Within normal limits   I have reviewed the images obtained: MRI brain and MRA head, I ordered coagulation parameters   ASSESSMENT AND PLAN  [name] is a 27 y.o. male past medical history of headaches thought to be migraine, pituitary mass into 7 years presents with sudden vision loss and headache. On examination found to have left, homonymous hemianopsia with MR imaging showing  a large left ICA aneurysm she gives the pituitary gland. I reviewed the images and agree that this is likely an aneurysm and not pituitary tumor. He is made nothing by mouth for diagnostic cerebral angiogram tomorrow morning.   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

## 2017-08-07 NOTE — ED Provider Notes (Signed)
Care assumed from  Wyn Quaker, PA-C at shift change with MRI pending.   In brief, this patient is a 27 y.o. M who presents for evaluation of headache and bitemporal hemianopsia that began today. Patient reports that he has a history of a pituitary mass that was diagnosed several years ago and has been stable since. He has not followed up outpatient neuro since evaluation of symptoms.   Discussed with neuro after CT head and recommended MRI for further evaluation.   PLAN: Follow-up on MRI. If MRI is unremarkable and patient's vision is normal, discharge with outpatient neuro. If MRI is negative and patient is symptomatic, get formal neuro evaluation.   MDM:  MRI reviewed. There is concern of a 12 mm intrasellar saccular aneurysm arising from the superior cavernous segment left internal carotid artery with mild mass effect on the pre-chiasmatic intracranial optic nerves.   Discussed patient with Dr. Estanislado Pandy (Neurosurgery) regarding MRI findings. He recommends admitting patient to obtain a diagnostic Angiogram tomorrow morning. If definitive, neurosurgery will plan to treat. Meanwhile recommends making patient NPO after midnight. Recommends one time dose of 10 mg decadron here in the ED and 4mg  decadron q8 hours. If patient's vision symptoms are stable and remain the same, plan for diagnostic angiogram in the morning. If patient's vision worsens plan for emergent consult to neurosurgery for further evaluation.   Discussed patient with Dr. Lorraine Lax (Neuro). Agrees with plan for admission. Will consult.   Discussed patient with Dr. Roel Cluck (hosiptalist). Will plan to admit.   1. Bilateral hemianopia   2. Aneurysm [facility])        Desma Mcgregor 08/07/17 2235    Hayden Rasmussen, MD 08/08/17 1135

## 2017-08-08 ENCOUNTER — Other Ambulatory Visit: Payer: Self-pay | Admitting: Neurosurgery

## 2017-08-08 ENCOUNTER — Encounter (HOSPITAL_COMMUNITY): Payer: Self-pay

## 2017-08-08 ENCOUNTER — Other Ambulatory Visit: Payer: Self-pay

## 2017-08-08 ENCOUNTER — Inpatient Hospital Stay (HOSPITAL_COMMUNITY): Payer: 59

## 2017-08-08 DIAGNOSIS — R739 Hyperglycemia, unspecified: Secondary | ICD-10-CM

## 2017-08-08 DIAGNOSIS — R7989 Other specified abnormal findings of blood chemistry: Secondary | ICD-10-CM

## 2017-08-08 DIAGNOSIS — D497 Neoplasm of unspecified behavior of endocrine glands and other parts of nervous system: Secondary | ICD-10-CM

## 2017-08-08 DIAGNOSIS — I729 Aneurysm of unspecified site: Secondary | ICD-10-CM

## 2017-08-08 DIAGNOSIS — G43809 Other migraine, not intractable, without status migrainosus: Secondary | ICD-10-CM

## 2017-08-08 DIAGNOSIS — G43909 Migraine, unspecified, not intractable, without status migrainosus: Secondary | ICD-10-CM | POA: Diagnosis present

## 2017-08-08 HISTORY — PX: IR ANGIO INTRA EXTRACRAN SEL COM CAROTID INNOMINATE BILAT MOD SED: IMG5360

## 2017-08-08 HISTORY — PX: IR ANGIO VERTEBRAL SEL VERTEBRAL BILAT MOD SED: IMG5369

## 2017-08-08 LAB — COMPREHENSIVE METABOLIC PANEL
ALT: 16 U/L — ABNORMAL LOW (ref 17–63)
AST: 20 U/L (ref 15–41)
Albumin: 4.2 g/dL (ref 3.5–5.0)
Alkaline Phosphatase: 51 U/L (ref 38–126)
Anion gap: 10 (ref 5–15)
BUN: 7 mg/dL (ref 6–20)
CO2: 22 mmol/L (ref 22–32)
Calcium: 9.6 mg/dL (ref 8.9–10.3)
Chloride: 109 mmol/L (ref 101–111)
Creatinine, Ser: 0.93 mg/dL (ref 0.61–1.24)
GFR calc Af Amer: >60 mL/min (ref >60)
GFR calc non Af Amer: >60 mL/min (ref >60)
Glucose, Bld: 188 mg/dL — ABNORMAL HIGH (ref 65–99)
Potassium: 4.2 mmol/L (ref 3.5–5.1)
Sodium: 141 mmol/L (ref 135–145)
Total Bilirubin: 0.7 mg/dL (ref 0.3–1.2)
Total Protein: 7 g/dL (ref 6.5–8.1)

## 2017-08-08 LAB — PROTIME-INR
INR: 1.03
Prothrombin Time: 13.4 seconds (ref 11.4–15.2)

## 2017-08-08 LAB — CBC
HCT: 44.4 % (ref 39.0–52.0)
Hemoglobin: 15.1 g/dL (ref 13.0–17.0)
MCH: 29.2 pg (ref 26.0–34.0)
MCHC: 34 g/dL (ref 30.0–36.0)
MCV: 85.7 fL (ref 78.0–100.0)
Platelets: 235 10*3/uL (ref 150–400)
RBC: 5.18 MIL/uL (ref 4.22–5.81)
RDW: 12.2 % (ref 11.5–15.5)
WBC: 6.7 10*3/uL (ref 4.0–10.5)

## 2017-08-08 LAB — HIV ANTIBODY (ROUTINE TESTING W REFLEX): HIV Screen 4th Generation wRfx: NONREACTIVE

## 2017-08-08 LAB — MRSA PCR SCREENING: MRSA by PCR: NEGATIVE

## 2017-08-08 LAB — T4, FREE: Free T4: 1.2 ng/dL — ABNORMAL HIGH (ref 0.61–1.12)

## 2017-08-08 LAB — PHOSPHORUS: Phosphorus: 2.7 mg/dL (ref 2.5–4.6)

## 2017-08-08 LAB — MAGNESIUM: Magnesium: 2.1 mg/dL (ref 1.7–2.4)

## 2017-08-08 LAB — HEMOGLOBIN A1C
Hgb A1c MFr Bld: 4.8 % (ref 4.8–5.6)
Mean Plasma Glucose: 91.06 mg/dL

## 2017-08-08 LAB — TSH: TSH: 0.17 u[IU]/mL — ABNORMAL LOW (ref 0.350–4.500)

## 2017-08-08 LAB — APTT: aPTT: 29 seconds (ref 24–36)

## 2017-08-08 MED ORDER — SODIUM CHLORIDE 0.9% FLUSH
3.0000 mL | Freq: Two times a day (BID) | INTRAVENOUS | Status: DC
  Administered 2017-08-08 – 2017-08-12 (×8): 3 mL via INTRAVENOUS

## 2017-08-08 MED ORDER — ONDANSETRON HCL 4 MG PO TABS: 4.0000 mg | ORAL_TABLET | Freq: Four times a day (QID) | ORAL | Status: DC | PRN

## 2017-08-08 MED ORDER — HEPARIN SODIUM (PORCINE) 1000 UNIT/ML IJ SOLN
INTRAMUSCULAR | Status: AC | PRN
  Administered 2017-08-08: 1000 [IU] via INTRAVENOUS

## 2017-08-08 MED ORDER — SODIUM CHLORIDE 0.9% FLUSH: 3.0000 mL | INTRAVENOUS | Status: DC | PRN

## 2017-08-08 MED ORDER — HEPARIN SODIUM (PORCINE) 1000 UNIT/ML IJ SOLN
INTRAMUSCULAR | Status: AC
  Filled 2017-08-08: qty 2

## 2017-08-08 MED ORDER — IOPAMIDOL (ISOVUE-300) INJECTION 61%
INTRAVENOUS | Status: AC
  Filled 2017-08-08: qty 50

## 2017-08-08 MED ORDER — ONDANSETRON HCL 4 MG/2ML IJ SOLN
4.0000 mg | Freq: Four times a day (QID) | INTRAMUSCULAR | Status: DC | PRN
  Administered 2017-08-09: 4 mg via INTRAVENOUS

## 2017-08-08 MED ORDER — DEXAMETHASONE SODIUM PHOSPHATE 10 MG/ML IJ SOLN
10.0000 mg | Freq: Four times a day (QID) | INTRAMUSCULAR | Status: DC
  Administered 2017-08-08 – 2017-08-09 (×4): 10 mg via INTRAVENOUS
  Filled 2017-08-08 (×4): qty 1

## 2017-08-08 MED ORDER — FENTANYL CITRATE (PF) 100 MCG/2ML IJ SOLN
INTRAMUSCULAR | Status: AC | PRN
  Administered 2017-08-08 (×2): 25 ug via INTRAVENOUS

## 2017-08-08 MED ORDER — HYDROCODONE-ACETAMINOPHEN 5-325 MG PO TABS
1.0000 | ORAL_TABLET | ORAL | Status: DC | PRN
  Administered 2017-08-08 (×2): 1 via ORAL
  Administered 2017-08-08: 2 via ORAL
  Administered 2017-08-08: 1 via ORAL
  Administered 2017-08-09: 2 via ORAL
  Filled 2017-08-08: qty 2
  Filled 2017-08-08: qty 1
  Filled 2017-08-08: qty 2
  Filled 2017-08-08 (×2): qty 1

## 2017-08-08 MED ORDER — IOPAMIDOL (ISOVUE-300) INJECTION 61%
INTRAVENOUS | Status: AC
  Administered 2017-08-08: 60 mL
  Filled 2017-08-08: qty 150

## 2017-08-08 MED ORDER — LORAZEPAM 1 MG PO TABS
0.5000 mg | ORAL_TABLET | Freq: Four times a day (QID) | ORAL | Status: DC | PRN
  Administered 2017-08-08 – 2017-08-09 (×3): 0.5 mg via ORAL
  Filled 2017-08-08 (×3): qty 1

## 2017-08-08 MED ORDER — SODIUM CHLORIDE 0.9 % IV SOLN
INTRAVENOUS | Status: DC
  Administered 2017-08-08 – 2017-08-09 (×2): via INTRAVENOUS

## 2017-08-08 MED ORDER — LIDOCAINE HCL (PF) 1 % IJ SOLN
INTRAMUSCULAR | Status: AC | PRN
  Administered 2017-08-08: 10 mL

## 2017-08-08 MED ORDER — SODIUM CHLORIDE 0.9 % IV SOLN: 250.0000 mL | INTRAVENOUS | Status: DC | PRN

## 2017-08-08 MED ORDER — MIDAZOLAM HCL 2 MG/2ML IJ SOLN
INTRAMUSCULAR | Status: AC | PRN
  Administered 2017-08-08: 1 mg via INTRAVENOUS

## 2017-08-08 MED ORDER — SODIUM CHLORIDE 0.9 % IV SOLN
INTRAVENOUS | Status: AC | PRN
  Administered 2017-08-08: 10 mL/h via INTRAVENOUS

## 2017-08-08 MED ORDER — FENTANYL CITRATE (PF) 100 MCG/2ML IJ SOLN
INTRAMUSCULAR | Status: AC
  Filled 2017-08-08: qty 2

## 2017-08-08 MED ORDER — MIDAZOLAM HCL 2 MG/2ML IJ SOLN
INTRAMUSCULAR | Status: AC
  Filled 2017-08-08: qty 2

## 2017-08-08 MED ORDER — ACETAMINOPHEN 325 MG PO TABS: 650.0000 mg | ORAL_TABLET | Freq: Four times a day (QID) | ORAL | Status: DC | PRN

## 2017-08-08 MED ORDER — ACETAMINOPHEN 650 MG RE SUPP: 650.0000 mg | Freq: Four times a day (QID) | RECTAL | Status: DC | PRN

## 2017-08-08 MED ORDER — LIDOCAINE HCL (PF) 1 % IJ SOLN
INTRAMUSCULAR | Status: AC
  Filled 2017-08-08: qty 30

## 2017-08-08 NOTE — Progress Notes (Signed)
Patient arrived to [facility] and was admitted to room 3M06 without difficulty.  Patient is alert and oriented.  Does complain of a headache 7/10 on pain scale located behind his left eye.  Vision is intact, peripheral vision checked and is intact.  Hand grasps strong and equal.  Able to follow all commands.  Patient remains NPO at this time.  Oriented to room and surroundings.  Ambulated to bathroom with touch assist for safety.  Voided clear yellow urine.  Denies nausea at this time.  Will continue to monitor.  Call bell within reach.

## 2017-08-08 NOTE — Sedation Documentation (Signed)
ETC02 removed per Dr. Deveshwar  

## 2017-08-08 NOTE — Plan of Care (Signed)
Patient VSS stable and neuro checks clear of deficits at this time

## 2017-08-08 NOTE — Progress Notes (Addendum)
[facility]                                                                              Patient Demographics  [name], is a 27 y.o. male, DOB - 1991/05/10, JIR:[id number]  Admit date - 08/07/2017   Admitting Physician Toy Baker, MD  Outpatient Primary MD for the patient is Patient, No Pcp Per  Outpatient specialists:   LOS - 1  days   Medical records reviewed and are as summarized below:    Chief Complaint  Patient presents with  . Migraine       Brief summary   Per admit note by Dr. Marvis Repress on 08/07/17:  [name] is a 27 y.o. male with medical history significant of pituitary mass migraine headaches, presented with sudden visual loss. Patient developed sudden onset around 28 AM of visual field loss he reportedly has known history of pituitary tumor but has not followed up with neurology. He states for the past 7 years he have had head ache behind left eye. Was told that he has pituitary tumor but never had MRA. At the time it was only 7mm and he chose not to undergo resection.  Patient was told he has  migraines and had similar episode in June 2018 for which he received migraine cocktail with improvement. He reported associated left-sided headache tat is persistent not worse with activity or bending down not worse with cought. He reported complete loss of vision and temporal sides of his vision bilaterally. 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 Acute renal gland is displaced left or right in the stalk is stressed over the dome of the aneurysm. There is no intrinsic pituitary tumor. Mild mass effect on the prechiasmatic intracranial optic nerves but chiasm is normally located. - Neurology  was consulted, felt patient had left home and emesis hemianopsia and recommended likely left ICA aneurysm causing his symptoms. Patient received IV Decadron for mass effect. - Recommended IR consult for diagnostic angiogram. - Patient underwent angiogram this morning, showed no angiographic evidence of aneurysm in the pituitary region, no contrast blush or AV shunting noted - Awaiting neurology recommendations regarding further management - Currently on IV Decadron 4 mg every 8 hours ADDENDUM 12:10pm  Received call from Dr Erlinda Hong, who feels that patient has no angiographic evidence of aneurysm and his symptoms of visual loss are due to pituitary tumor. Recommended neurosurgery consult. - Called neurosurgery.   Active Problems:   Migraine - Currently stable, continue IV Decadron, received migraine cocktail with the Toradol, Reglan and dexamethasone   Code Status: Full CODE STATUS DVT Prophylaxis:  SCDs Family Communication: Discussed in detail with the patient, all imaging results, lab results explained to the patient    Disposition Plan: Awaiting neurology recommendation  Time Spent in minutes 25 minutes  Procedures:  MRA brain  Cerebral angiogram 2/14  Consultants:   Neurology Interventional  radiology  Antimicrobials:      Medications  Scheduled Meds: . dexamethasone  4 mg Intravenous Q8H  . fentaNYL      . heparin      . lidocaine (PF)      . midazolam      . sodium chloride flush  3 mL Intravenous Q12H   Continuous Infusions: . sodium chloride    . sodium chloride     PRN Meds:.sodium chloride, acetaminophen **OR** acetaminophen, HYDROcodone-acetaminophen, ondansetron **OR** ondansetron (ZOFRAN) IV, sodium chloride flush   Antibiotics   Anti-infectives (From admission, onward)   None        Subjective:   Chue Berkovich was seen and examined today.  States no significant improvement in the vision, headache is improving. Patient denies dizziness, chest  pain, shortness of breath, abdominal pain, N/V/D/C, new weakness. No acute events overnight.    Objective:   Vitals:   08/08/17 0920 08/08/17 0932 08/08/17 0945 08/08/17 1000  BP: 124/74 124/82 125/84 123/66  Pulse: 62 85 97 82  Resp: 13 18 19  (!) 27  Temp:   98.2 F (36.8 C)   TempSrc:   Oral   SpO2: 98% 100% 98% 97%  Weight:      Height:        Intake/Output Summary (Last 24 hours) at 08/08/2017 1035 Last data filed at 08/08/2017 0734 Gross per 24 hour  Intake 1007 ml  Output 200 ml  Net 807 ml     Wt Readings from Last 3 Encounters:  08/08/17 74.2 kg (163 lb 9.3 oz)     Exam  General: Alert and oriented x 3, NAD  Eyes  HEENT:  Atraumatic, normocephalic  Cardiovascular: S1 S2 auscultated, no rubs, murmurs or gallops. Regular rate and rhythm.  Respiratory: Clear to auscultation bilaterally, no wheezing, rales or rhonchi  Gastrointestinal: Soft, nontender, nondistended, + bowel sounds  Ext: no pedal edema bilaterally  Neuro: AAOx3, Cr N's II- XII. Strength 5/5 upper and lower extremities bilaterally, speech clear,  Musculoskeletal: No digital cyanosis, clubbing  Skin: No rashes  Psych: Normal affect and demeanor, alert and oriented x3    Data Reviewed:  I have personally reviewed following labs and imaging studies  Micro Results Recent Results (from the past 240 hour(s))  MRSA PCR Screening     Status: None   Collection Time: 08/08/17 12:00 AM  Result Value Ref Range Status   MRSA by PCR NEGATIVE NEGATIVE Final    Comment:        The GeneXpert MRSA Assay (FDA approved for NASAL specimens only), is one component of a comprehensive MRSA colonization surveillance program. It is not intended to diagnose MRSA infection nor to guide or monitor treatment for MRSA infections. Performed at [facility], [place] [address], [place], [place] [address]     Radiology Reports Ct Head Wo Contrast  Result Date: 08/07/2017 CLINICAL DATA:  Headache for 3  hours. EXAM: CT HEAD WITHOUT CONTRAST TECHNIQUE: Contiguous axial images were obtained from the base of the skull through the vertex without intravenous contrast. COMPARISON:  12/02/2016 FINDINGS: Brain: The ventricles are normal in size and configuration. No extra-axial fluid collections are identified. The gray-white differentiation is maintained. No CT findings for acute hemispheric infarction or intracranial hemorrhage. There is a stable pituitary lesion on the left side measuring a maximum of 12 mm. No change when compared to the prior CT scan. The brainstem and cerebellum are normal. Vascular: No hyperdense vessels or obvious aneurysm. Skull: No acute  skull fracture.  No bone lesion. Sinuses/Orbits: The paranasal sinuses and mastoid air cells are clear. The globes are intact. Other: No scalp lesions, laceration or hematoma. 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 No new/acute intracranial findings. Electronically Signed   By: Marijo Sanes M.D.   On: 08/07/2017 17:16   Mr Angiogram Head Wo Contrast  Result Date: 08/07/2017 CLINICAL DATA:  Headaches with reported bitemporal visual loss. Previous history of severe headaches. EXAM: MRI HEAD WITHOUT AND WITH CONTRAST MRA HEAD WITHOUT CONTRAST TECHNIQUE: Multiplanar, multiecho pulse sequences of the brain and surrounding structures were obtained without and with intravenous contrast. Angiographic images of the head were obtained using MRA technique without contrast. CONTRAST:  72mL MULTIHANCE GADOBENATE DIMEGLUMINE 529 MG/ML IV SOLN COMPARISON:  CT head 08/07/2017.  CT head 12/02/2016. FINDINGS: MRI HEAD FINDINGS Brain: No acute stroke, acute hemorrhage, intra-axial mass lesion, hydrocephalus, or extra-axial fluid. Normal for age cerebral volume. No white matter disease. 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 There is slight heterogeneity of signal  within the lesion on T1 and FLAIR imaging, consistent with swirling blood. The lesion is roughly spherical, 12 mm diameter. The lesion does not enhance in conjunction with pituitary. Pituitary stalk is deviated LEFT-to-RIGHT. The lesion is outside the pituitary gland, but does accumulate contrast on post infusion imaging. The lesion does demonstrate mild mass effect on the pre chiasmatic intracranial optic nerves but the chiasm appears normally located, and surrounded by CSF. Vascular: Carotid, basilar, vertebral flow voids are preserved. The intrasellar structure appears to have an arterial signature, with flow void similar to the carotid artery. Skull and upper cervical spine: Normal marrow signal. Sinuses/Orbits: Negative. Other: Compared with prior CT, similar appearance. MRA HEAD FINDINGS The internal carotid arteries are widely patent. The basilar artery is widely patent with vertebrals codominant. There is no intracranial stenosis. There is T1 shortening associated with abnormal flow related enhancement of an intrasellar 12 mm aneurysm arising from the superior cavernous segment of the LEFT internal carotid artery. There appears to have a narrow neck based on examination is series 12, image 60 axial source image. 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 The pituitary gland is displaced LEFT-to-RIGHT, in the stalk is stretched over the dome of the aneurysm. There is no intrinsic pituitary tumor. Mild mass effect on the pre chiasmatic intracranial optic nerves, but the chiasm appears normally located. Neuro-Interventional Radiology consultation is suggested to evaluate the appropriateness of potential treatment. Non-emergent evaluation can be arranged by calling [phone] during usual hours. Emergency evaluation can be requested by paging [phone]. Electronically Signed   By: Staci Righter M.D.   On: 08/07/2017 18:50   Mr  Jeri Cos And Wo Contrast  Result Date: 08/07/2017 CLINICAL DATA:  Headaches with reported bitemporal visual loss. Previous history of severe headaches. EXAM: MRI HEAD WITHOUT AND WITH CONTRAST MRA HEAD WITHOUT CONTRAST TECHNIQUE: Multiplanar, multiecho pulse sequences of the brain and surrounding structures were obtained without and with intravenous contrast. Angiographic images of the head were obtained using MRA technique without contrast. CONTRAST:  40mL MULTIHANCE GADOBENATE DIMEGLUMINE 529 MG/ML IV SOLN COMPARISON:  CT head 08/07/2017.  CT head 12/02/2016. FINDINGS: MRI HEAD FINDINGS Brain: No acute stroke, acute hemorrhage, intra-axial mass lesion, hydrocephalus, or extra-axial fluid. Normal for age cerebral volume. No white matter disease. There is a roughly spherical mass  in the sella turcica, arising from the LEFT side, with central flow void on T2 weighted images. There is slight heterogeneity of signal within the lesion on T1 and FLAIR imaging, consistent with swirling blood. The lesion is roughly spherical, 12 mm diameter. The lesion does not enhance in conjunction with pituitary. Pituitary stalk is deviated LEFT-to-RIGHT. The lesion is outside the pituitary gland, but does accumulate contrast on post infusion imaging. The lesion does demonstrate mild mass effect on the pre chiasmatic intracranial optic nerves but the chiasm appears normally located, and surrounded by CSF. Vascular: Carotid, basilar, vertebral flow voids are preserved. The intrasellar structure appears to have an arterial signature, with flow void similar to the carotid artery. Skull and upper cervical spine: Normal marrow signal. Sinuses/Orbits: Negative. Other: Compared with prior CT, similar appearance. MRA HEAD FINDINGS The internal carotid arteries are widely patent. The basilar artery is widely patent with vertebrals codominant. There is no intracranial stenosis. 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 There appears to have a narrow neck based on examination is series 12, image 60 axial source image. 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 The pituitary gland is displaced LEFT-to-RIGHT, in the stalk is stretched over the dome of the aneurysm. There is no intrinsic pituitary tumor. Mild mass effect on the pre chiasmatic intracranial optic nerves, but the chiasm appears normally located. Neuro-Interventional Radiology consultation is suggested to evaluate the appropriateness of potential treatment. Non-emergent evaluation can be arranged by calling [phone] during usual hours. Emergency evaluation can be requested by paging [phone]. Electronically Signed   By: Staci Righter M.D.   On: 08/07/2017 18:50    Lab Data:  CBC: Recent Labs  Lab 08/07/17 2042 08/08/17 0023  WBC 10.0 6.7  NEUTROABS 9.3*  --   HGB 15.4 15.1  HCT 45.1 44.4  MCV 86.4 85.7  PLT 194 678   Basic Metabolic Panel: Recent Labs  Lab 08/07/17 2042 08/08/17 0023  NA 137 141  K 4.0 4.2  CL 106 109  CO2 19* 22  GLUCOSE 191* 188*  BUN 8 7  CREATININE 0.96 0.93  CALCIUM 9.4 9.6  MG  --  2.1  PHOS  --  2.7   GFR: Estimated Creatinine Clearance: 123.2 mL/min (by C-G formula based on SCr of 0.93 mg/dL). Liver Function Tests: Recent Labs  Lab 08/08/17 0023  AST 20  ALT 16*  ALKPHOS 51  BILITOT 0.7  PROT 7.0  ALBUMIN 4.2   No results for input(s): LIPASE, AMYLASE in the last 168 hours. No results for input(s): AMMONIA in the last 168 hours. Coagulation Profile: Recent Labs  Lab 08/08/17 0023  INR 1.03   Cardiac Enzymes: No results for input(s): CKTOTAL, CKMB, CKMBINDEX, TROPONINI in the last 168 hours. BNP (last 3 results) No results for input(s): PROBNP in the last 8760 hours. HbA1C: No  results for input(s): HGBA1C in the last 72 hours. CBG: No results for input(s): GLUCAP in the last 168 hours. Lipid Profile: No results for input(s): CHOL, HDL, LDLCALC, TRIG, CHOLHDL, LDLDIRECT in the last 72 hours. Thyroid Function Tests: Recent Labs    08/08/17 0023  TSH 0.170*   Anemia Panel: No results for input(s): VITAMINB12, FOLATE, FERRITIN, TIBC, IRON, RETICCTPCT in the last 72 hours. Urine analysis: No results found for: COLORURINE, APPEARANCEUR, [place], [place], [place], [place],  BILIRUBINUR, KETONESUR, PROTEINUR, UROBILINOGEN, NITRITE, LEUKOCYTESUR   Ledora Delker M.D. [facility] 08/08/2017, 10:35 AM  Pager: [phone] Between 7am to 7pm - call Pager - [phone]  After 7pm go to [email or web address] - password [email or web address]  Call night coverage person covering after 7pm

## 2017-08-08 NOTE — Consult Note (Signed)
Reason for Consult:Pituitary mass Referring Physician: [name] is an 27 y.o. male.   HPI: [name] a 27 y.o.malewith medical history significant of pituitary mass migraine headaches, presented withsudden visual loss yesterday. Patient developed sudden onset around11AM of visual field loss he reportedly has known history of pituitary tumor but has not followed up with neurology. He states for the past 7 years he have had head ache behind left eye. Was told that he has pituitary tumor but never had MRA. At the time it was only 80m and he chose not to undergo resection.Patient was told he hasmigraines and had similar episode in June 2018 for which he received migraine cocktail with improvement. He reported associated left-sided headache that is persistent not worse with activity or bending down not worse with cought.He reported complete loss of vision and temporal sides of his vision bilaterally.  Yesterday, he graded his headache as a 9/10.  Today he grades it as a 7/10.  He does not note any improvement in his vision.  He says his vision is a bit blurry, but that he is not wearing his contacts, so he can't tell if his vision is any different. No LOC no trauma to the head no nausea vomiting.  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  Angiogram today was entirely negative for aneurysm, AVM, or vascular abnormality.  Endocrine workup of pituitary mass is underway, with prolactin level pending.  [facility].    Past Medical History:  Diagnosis Date  . Headache     History reviewed. No pertinent surgical history.  Family History  Problem Relation Age of Onset  . Diabetes Mellitus I Sister   . CAD Other     Social History:  reports that  has never smoked. he has never used smokeless  tobacco. He reports that he drinks about 1.2 oz of alcohol per week. He reports that he does not use drugs.  Allergies:  Allergies  Allergen Reactions  . Codeine Nausea And Vomiting    Medications: Reviewed  Results for orders placed or performed during the hospital encounter of 08/07/17 (from the past 48 hour(s))  CBC with Differential     Status: Abnormal   Collection Time: 08/07/17  8:42 PM  Result Value Ref Range   WBC 10.0 4.0 - 10.5 K/uL   RBC 5.22 4.22 - 5.81 MIL/uL   Hemoglobin 15.4 13.0 - 17.0 g/dL   HCT 45.1 39.0 - 52.0 %   MCV 86.4 78.0 - 100.0 fL   MCH 29.5 26.0 - 34.0 pg   MCHC 34.1 30.0 - 36.0 g/dL   RDW 12.5 11.5 - 15.5 %   Platelets 194 150 - 400 K/uL   Neutrophils Relative % 93 %   Neutro Abs 9.3 (H) 1.7 - 7.7 K/uL   Lymphocytes Relative 6 %   Lymphs Abs 0.6 (L) 0.7 - 4.0 K/uL   Monocytes Relative 1 %   Monocytes Absolute 0.1 0.1 - 1.0 K/uL   Eosinophils Relative 0 %   Eosinophils Absolute 0.0 0.0 - 0.7 K/uL   Basophils Relative 0 %   Basophils Absolute 0.0 0.0 - 0.1 K/uL    Comment: Performed at [facility] [address], [place] [id number] Basic metabolic panel     Status: Abnormal   Collection Time: 08/07/17  8:42  PM  Result Value Ref Range   Sodium 137 135 - 145 mmol/L   Potassium 4.0 3.5 - 5.1 mmol/L   Chloride 106 101 - 111 mmol/L   CO2 19 (L) 22 - 32 mmol/L   Glucose, Bld 191 (H) 65 - 99 mg/dL   BUN 8 6 - 20 mg/dL   Creatinine, Ser 0.96 0.61 - 1.24 mg/dL   Calcium 9.4 8.9 - 10.3 mg/dL   GFR calc non Af Amer >60 >60 mL/min   GFR calc Af Amer >60 >60 mL/min    Comment: (NOTE) The eGFR has been calculated using the CKD EPI equation. This calculation has not been validated in all clinical situations. eGFR's persistently <60 mL/min signify possible Chronic Kidney Disease.    Anion gap 12 5 - 15    Comment: Performed at [facility] [address], [place], [place] [address]  MRSA PCR Screening     Status: None    Collection Time: 08/08/17 12:00 AM  Result Value Ref Range   MRSA by PCR NEGATIVE NEGATIVE    Comment:        The GeneXpert MRSA Assay (FDA approved for NASAL specimens only), is one component of a comprehensive MRSA colonization surveillance program. It is not intended to diagnose MRSA infection nor to guide or monitor treatment for MRSA infections. Performed at [facility], [place] [address], [place], [place] [address]   Protime-INR     Status: None   Collection Time: 08/08/17 12:23 AM  Result Value Ref Range   Prothrombin Time 13.4 11.4 - 15.2 seconds   INR 1.03     Comment: Performed at [facility] [address], [place], [place] [address]  APTT     Status: None   Collection Time: 08/08/17 12:23 AM  Result Value Ref Range   aPTT 29 24 - 36 seconds    Comment: Performed at [facility] [address], [place], [place] [address]  HIV antibody (Routine Testing)     Status: None   Collection Time: 08/08/17 12:23 AM  Result Value Ref Range   HIV Screen 4th Generation wRfx Non Reactive Non Reactive    Comment: (NOTE) Performed At: [facility] [place], [place] [id number] Rush Farmer MD [id number] Performed at [facility], [place] [address], [place], [place] [address]   Magnesium     Status: None   Collection Time: 08/08/17 12:23 AM  Result Value Ref Range   Magnesium 2.1 1.7 - 2.4 mg/dL    Comment: Performed at [facility], [place] [address], [place], [place] [address]  Phosphorus     Status: None   Collection Time: 08/08/17 12:23 AM  Result Value Ref Range   Phosphorus 2.7 2.5 - 4.6 mg/dL    Comment: Performed at [facility] [address], [place], [place] [address]  TSH     Status: Abnormal   Collection Time: 08/08/17 12:23 AM  Result Value Ref Range   TSH 0.170 (L) 0.350 - 4.500 uIU/mL    Comment: Performed by a 3rd Generation assay with a functional sensitivity of <=0.01 uIU/mL. Performed at [facility], [place] [address] [place], [place] [address]   Comprehensive metabolic panel     Status: Abnormal   Collection Time: 08/08/17 12:23 AM  Result Value Ref Range   Sodium 141 135 - 145 mmol/L   Potassium 4.2 3.5 - 5.1 mmol/L  Chloride 109 101 - 111 mmol/L   CO2 22 22 - 32 mmol/L   Glucose, Bld 188 (H) 65 - 99 mg/dL   BUN 7 6 - 20 mg/dL   Creatinine, Ser 0.93 0.61 - 1.24 mg/dL   Calcium 9.6 8.9 - 10.3 mg/dL   Total Protein 7.0 6.5 - 8.1 g/dL   Albumin 4.2 3.5 - 5.0 g/dL   AST 20 15 - 41 U/L   ALT 16 (L) 17 - 63 U/L   Alkaline Phosphatase 51 38 - 126 U/L   Total Bilirubin 0.7 0.3 - 1.2 mg/dL   GFR calc non Af Amer >60 >60 mL/min   GFR calc Af Amer >60 >60 mL/min    Comment: (NOTE) The eGFR has been calculated using the CKD EPI equation. This calculation has not been validated in all clinical situations. eGFR's persistently <60 mL/min signify possible Chronic Kidney Disease.    Anion gap 10 5 - 15    Comment: Performed at [facility] [address], [place] [address]  CBC     Status: None   Collection Time: 08/08/17 12:23 AM  Result Value Ref Range   WBC 6.7 4.0 - 10.5 K/uL   RBC 5.18 4.22 - 5.81 MIL/uL   Hemoglobin 15.1 13.0 - 17.0 g/dL   HCT 44.4 39.0 - 52.0 %   MCV 85.7 78.0 - 100.0 fL   MCH 29.2 26.0 - 34.0 pg   MCHC 34.0 30.0 - 36.0 g/dL   RDW 12.2 11.5 - 15.5 %   Platelets 235 150 - 400 K/uL    Comment: Performed at [facility], [place] [address] [place], [place] [address]  Hemoglobin A1c     Status: None   Collection Time: 08/08/17 12:23 AM  Result Value Ref Range   Hgb A1c MFr Bld 4.8 4.8 - 5.6 %    Comment: (NOTE) Pre diabetes:          5.7%-6.4% Diabetes:              >6.4% Glycemic control for   <7.0% adults with diabetes    Mean Plasma Glucose 91.06 mg/dL    Comment: Performed at [facility] [address], [place], [place] [address]  T4, free     Status: Abnormal   Collection Time: 08/08/17 12:23 AM  Result  Value Ref Range   Free T4 1.20 (H) 0.61 - 1.12 ng/dL    Comment: (NOTE) Biotin ingestion may interfere with free T4 tests. If the results are inconsistent with the TSH level, previous test results, or the clinical presentation, then consider biotin interference. If needed, order repeat testing after stopping biotin. Performed at [facility], [place] [address] , [place] [address]     Ct Head Wo Contrast  Result Date: 08/07/2017 CLINICAL DATA:  Headache for 3 hours. EXAM: CT HEAD WITHOUT CONTRAST TECHNIQUE: Contiguous axial images were obtained from the base of the skull through the vertex without intravenous contrast. COMPARISON:  12/02/2016 FINDINGS: Brain: The ventricles are normal in size and configuration. No extra-axial fluid collections are identified. The gray-white differentiation is maintained. No CT findings for acute hemispheric infarction or intracranial hemorrhage. There is a stable pituitary lesion on the left side measuring a maximum of 12 mm. No change when compared to the prior CT scan. The brainstem and cerebellum are normal. Vascular: No hyperdense vessels or obvious aneurysm. Skull: No acute skull fracture.  No bone lesion. Sinuses/Orbits: The paranasal sinuses  and mastoid air cells are clear. The globes are intact. Other: No scalp lesions, laceration or hematoma. 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 No new/acute intracranial findings. Electronically Signed   By: Marijo Sanes M.D.   On: 08/07/2017 17:16   Mr Angiogram Head Wo Contrast  Addendum Date: 08/08/2017   ADDENDUM REPORT: 08/08/2017 13:21 ADDENDUM: Catheter angiogram demonstrates no vascular component to the lesion. No evidence for origin from the LEFT ICA. Consideration should be given to nonvascular intrasellar lesion. Given the T1 shortening and peripheral calcification, and ovoid to spherical shape, Rathke's cleft cyst would be a consideration. Eccentric macroadenoma with mucinous  secretion or blood products is less favored. Craniopharyngioma is unlikely. Electronically Signed   By: Staci Righter M.D.   On: 08/08/2017 13:21   Result Date: 08/08/2017 CLINICAL DATA:  Headaches with reported bitemporal visual loss. Previous history of severe headaches. EXAM: MRI HEAD WITHOUT AND WITH CONTRAST MRA HEAD WITHOUT CONTRAST TECHNIQUE: Multiplanar, multiecho pulse sequences of the brain and surrounding structures were obtained without and with intravenous contrast. Angiographic images of the head were obtained using MRA technique without contrast. CONTRAST:  42m MULTIHANCE GADOBENATE DIMEGLUMINE 529 MG/ML IV SOLN COMPARISON:  CT head 08/07/2017.  CT head 12/02/2016. FINDINGS: MRI HEAD FINDINGS Brain: No acute stroke, acute hemorrhage, intra-axial mass lesion, hydrocephalus, or extra-axial fluid. Normal for age cerebral volume. No white matter disease. 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 There is slight heterogeneity of signal within the lesion on T1 and FLAIR imaging, consistent with swirling blood. The lesion is roughly spherical, 12 mm diameter. The lesion does not enhance in conjunction with pituitary. Pituitary stalk is deviated LEFT-to-RIGHT. The lesion is outside the pituitary gland, but does accumulate contrast on post infusion imaging. The lesion does demonstrate mild mass effect on the pre chiasmatic intracranial optic nerves but the chiasm appears normally located, and surrounded by CSF. Vascular: Carotid, basilar, vertebral flow voids are preserved. The intrasellar structure appears to have an arterial signature, with flow void similar to the carotid artery. Skull and upper cervical spine: Normal marrow signal. Sinuses/Orbits: Negative. Other: Compared with prior CT, similar appearance. MRA HEAD FINDINGS The internal carotid arteries are widely patent. The basilar artery is widely patent with  vertebrals codominant. There is no intracranial stenosis. There is T1 shortening associated with abnormal flow related enhancement of an intrasellar 12 mm aneurysm arising from the superior cavernous segment of the LEFT internal carotid artery. There appears to have a narrow neck based on examination is series 12, image 60 axial source image. 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 The pituitary gland is displaced LEFT-to-RIGHT, in the stalk is stretched over the dome of the aneurysm. There is no intrinsic pituitary tumor. Mild mass effect on the pre chiasmatic intracranial optic nerves, but the chiasm appears normally located. Neuro-Interventional Radiology consultation is suggested to evaluate the appropriateness of potential treatment. Non-emergent evaluation can be arranged by calling [phone] usual hours. Emergency evaluation can be requested by paging [phone] Electronically Signed: By: JStaci RighterM.D. On: 08/07/2017 18:50   Mr BJeri CosAnd Wo Contrast  Addendum Date: 08/08/2017   ADDENDUM REPORT: 08/08/2017 13:21 ADDENDUM: Catheter angiogram demonstrates no vascular component to the lesion. No evidence for origin from the LEFT ICA. Consideration should be given to nonvascular intrasellar lesion. Given the T1 shortening  and peripheral calcification, and ovoid to spherical shape, Rathke's cleft cyst would be a consideration. Eccentric macroadenoma with mucinous secretion or blood products is less favored. Craniopharyngioma is unlikely. Electronically Signed   By: Staci Righter M.D.   On: 08/08/2017 13:21   Result Date: 08/08/2017 CLINICAL DATA:  Headaches with reported bitemporal visual loss. Previous history of severe headaches. EXAM: MRI HEAD WITHOUT AND WITH CONTRAST MRA HEAD WITHOUT CONTRAST TECHNIQUE: Multiplanar, multiecho pulse sequences of the brain and surrounding structures were obtained without  and with intravenous contrast. Angiographic images of the head were obtained using MRA technique without contrast. CONTRAST:  50m MULTIHANCE GADOBENATE DIMEGLUMINE 529 MG/ML IV SOLN COMPARISON:  CT head 08/07/2017.  CT head 12/02/2016. FINDINGS: MRI HEAD FINDINGS Brain: No acute stroke, acute hemorrhage, intra-axial mass lesion, hydrocephalus, or extra-axial fluid. Normal for age cerebral volume. No white matter disease. 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 There is slight heterogeneity of signal within the lesion on T1 and FLAIR imaging, consistent with swirling blood. The lesion is roughly spherical, 12 mm diameter. The lesion does not enhance in conjunction with pituitary. Pituitary stalk is deviated LEFT-to-RIGHT. The lesion is outside the pituitary gland, but does accumulate contrast on post infusion imaging. The lesion does demonstrate mild mass effect on the pre chiasmatic intracranial optic nerves but the chiasm appears normally located, and surrounded by CSF. Vascular: Carotid, basilar, vertebral flow voids are preserved. The intrasellar structure appears to have an arterial signature, with flow void similar to the carotid artery. Skull and upper cervical spine: Normal marrow signal. Sinuses/Orbits: Negative. Other: Compared with prior CT, similar appearance. MRA HEAD FINDINGS The internal carotid arteries are widely patent. The basilar artery is widely patent with vertebrals codominant. There is no intracranial stenosis. There is T1 shortening associated with abnormal flow related enhancement of an intrasellar 12 mm aneurysm arising from the superior cavernous segment of the LEFT internal carotid artery. There appears to have a narrow neck based on examination is series 12, image 60 axial source image. 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 The pituitary gland is displaced LEFT-to-RIGHT, in the stalk is stretched over the dome of the aneurysm. There is no intrinsic pituitary tumor. Mild mass effect on the pre chiasmatic intracranial optic nerves, but the chiasm appears normally located. Neuro-Interventional Radiology consultation is suggested to evaluate the appropriateness of potential treatment. Non-emergent evaluation can be arranged by calling [phone] usual hours. Emergency evaluation can be requested by paging [phone] Electronically Signed: By: JStaci RighterM.D. On: 08/07/2017 18:50    Review of Systems - Otherwise unremarkable except for history of headaches.   Blood pressure 123/87, pulse 81, temperature 97.9 F (36.6 C), temperature source Oral, resp. rate 14, height 5' 10"  (1.778 m), weight 74.2 kg (163 lb 9.3 oz), SpO2 97 %. Physical Exam  Constitutional: He is oriented to person, place, and time. He appears well-developed and well-nourished.  HENT:  Head: Normocephalic and atraumatic.  Eyes: EOM are normal. Pupils are equal, round, and reactive to light.  Bitemporal hemianopsia on confrontational exam, incomplete, left slightly worse than right  Neck: Trachea normal, normal range of motion and phonation normal. Neck supple. No Brudzinski's sign and no Kernig's sign noted.  Cardiovascular: Normal rate and regular rhythm.  Respiratory: Effort normal and breath sounds normal.  GI: Soft. Normal appearance. There is no  tenderness.  Neurological: He is alert and oriented to person, place, and time. He has normal strength and normal reflexes. A cranial nerve deficit is present. No sensory deficit. GCS eye subscore is 4. GCS verbal subscore is 5. GCS motor subscore is 6.  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  No pronator drift.  Psychiatric: He has a normal mood and affect. His speech is normal and behavior is normal. Judgment and thought content normal. Cognition and memory are normal.     Assessment/Plan: The patient has a pituitary mass, which has been known for many years, previously evaluated in [place] and is now larger.  Endocrine workup is currently not known.  Prolactin level pending.  Somatomedin C ordered.  The patient complains of sudden onset of headache and bitemporal vision loss.  This may reflect hemorrhage into pituitary tumor, but surprisingly, there does not appear to be significant chiasmatic compression on recent MRI.  I have started the patient on high dose decadron at 10 mg Q 6 hours and will see if his headache and vision improves.  If it does not, I have recommended proceeding with transsphenoidal resection of pituitary tumor tomorrow to decompress his optic apparatus. Angiography clearly demonstrates that this mass is not an aneurysm.  Patient wishes to proceed with surgery if his vision has not improved with steroids overnight.   Peggyann Shoals, MD 08/08/2017, 4:00 PM

## 2017-08-08 NOTE — H&P (View-Only) (Signed)
Reason for Consult:Pituitary mass Referring Physician: Danna Casella is an 27 y.o. male.   HPI: [name] a 27 y.o.malewith medical history significant of pituitary mass migraine headaches, presented withsudden visual loss yesterday. Patient developed sudden onset around11AM of visual field loss he reportedly has known history of pituitary tumor but has not followed up with neurology. He states for the past 7 years he have had head ache behind left eye. Was told that he has pituitary tumor but never had MRA. At the time it was only 76m and he chose not to undergo resection.Patient was told he hasmigraines and had similar episode in June 2018 for which he received migraine cocktail with improvement. He reported associated left-sided headache that is persistent not worse with activity or bending down not worse with cought.He reported complete loss of vision and temporal sides of his vision bilaterally.  Yesterday, he graded his headache as a 9/10.  Today he grades it as a 7/10.  He does not note any improvement in his vision.  He says his vision is a bit blurry, but that he is not wearing his contacts, so he can't tell if his vision is any different. No LOC no trauma to the head no nausea vomiting.  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  Angiogram today was entirely negative for aneurysm, AVM, or vascular abnormality.  Endocrine workup of pituitary mass is underway, with prolactin level pending.  [facility].    Past Medical History:  Diagnosis Date  . Headache     History reviewed. No pertinent surgical history.  Family History  Problem Relation Age of Onset  . Diabetes Mellitus I Sister   . CAD Other     Social History:  reports that  has never smoked. he has never used smokeless  tobacco. He reports that he drinks about 1.2 oz of alcohol per week. He reports that he does not use drugs.  Allergies:  Allergies  Allergen Reactions  . Codeine Nausea And Vomiting    Medications: Reviewed  Results for orders placed or performed during the hospital encounter of 08/07/17 (from the past 48 hour(s))  CBC with Differential     Status: Abnormal   Collection Time: 08/07/17  8:42 PM  Result Value Ref Range   WBC 10.0 4.0 - 10.5 K/uL   RBC 5.22 4.22 - 5.81 MIL/uL   Hemoglobin 15.4 13.0 - 17.0 g/dL   HCT 45.1 39.0 - 52.0 %   MCV 86.4 78.0 - 100.0 fL   MCH 29.5 26.0 - 34.0 pg   MCHC 34.1 30.0 - 36.0 g/dL   RDW 12.5 11.5 - 15.5 %   Platelets 194 150 - 400 K/uL   Neutrophils Relative % 93 %   Neutro Abs 9.3 (H) 1.7 - 7.7 K/uL   Lymphocytes Relative 6 %   Lymphs Abs 0.6 (L) 0.7 - 4.0 K/uL   Monocytes Relative 1 %   Monocytes Absolute 0.1 0.1 - 1.0 K/uL   Eosinophils Relative 0 %   Eosinophils Absolute 0.0 0.0 - 0.7 K/uL   Basophils Relative 0 %   Basophils Absolute 0.0 0.0 - 0.1 K/uL    Comment: Performed at [facility] [address] [place] [id number] Basic metabolic panel     Status: Abnormal   Collection Time: 08/07/17  8:42  PM  Result Value Ref Range   Sodium 137 135 - 145 mmol/L   Potassium 4.0 3.5 - 5.1 mmol/L   Chloride 106 101 - 111 mmol/L   CO2 19 (L) 22 - 32 mmol/L   Glucose, Bld 191 (H) 65 - 99 mg/dL   BUN 8 6 - 20 mg/dL   Creatinine, Ser 0.96 0.61 - 1.24 mg/dL   Calcium 9.4 8.9 - 10.3 mg/dL   GFR calc non Af Amer >60 >60 mL/min   GFR calc Af Amer >60 >60 mL/min    Comment: (NOTE) The eGFR has been calculated using the CKD EPI equation. This calculation has not been validated in all clinical situations. eGFR's persistently <60 mL/min signify possible Chronic Kidney Disease.    Anion gap 12 5 - 15    Comment: Performed at [facility] [address] [place], [place] [address]  MRSA PCR Screening     Status: None    Collection Time: 08/08/17 12:00 AM  Result Value Ref Range   MRSA by PCR NEGATIVE NEGATIVE    Comment:        The GeneXpert MRSA Assay (FDA approved for NASAL specimens only), is one component of a comprehensive MRSA colonization surveillance program. It is not intended to diagnose MRSA infection nor to guide or monitor treatment for MRSA infections. Performed at [facility], [place] [address] [place], [place] [address]   Protime-INR     Status: None   Collection Time: 08/08/17 12:23 AM  Result Value Ref Range   Prothrombin Time 13.4 11.4 - 15.2 seconds   INR 1.03     Comment: Performed at [facility] [address] [place], [place] [address]  APTT     Status: None   Collection Time: 08/08/17 12:23 AM  Result Value Ref Range   aPTT 29 24 - 36 seconds    Comment: Performed at [facility] [address], [place], [place] [address]  HIV antibody (Routine Testing)     Status: None   Collection Time: 08/08/17 12:23 AM  Result Value Ref Range   HIV Screen 4th Generation wRfx Non Reactive Non Reactive    Comment: (NOTE) Performed At: [facility] [place], [place] [id number] Rush Farmer MD PJ:[id number] Performed at [facility], [place] [address] [place], [place] [address]   Magnesium     Status: None   Collection Time: 08/08/17 12:23 AM  Result Value Ref Range   Magnesium 2.1 1.7 - 2.4 mg/dL    Comment: Performed at [facility], [place] [address], [place], [place] [address]  Phosphorus     Status: None   Collection Time: 08/08/17 12:23 AM  Result Value Ref Range   Phosphorus 2.7 2.5 - 4.6 mg/dL    Comment: Performed at [facility] [address], [place], [place] [address]  TSH     Status: Abnormal   Collection Time: 08/08/17 12:23 AM  Result Value Ref Range   TSH 0.170 (L) 0.350 - 4.500 uIU/mL    Comment: Performed by a 3rd Generation assay with a functional sensitivity of <=0.01 uIU/mL. Performed at [facility], [place] [address] [place], [place] [address]   Comprehensive metabolic panel     Status: Abnormal   Collection Time: 08/08/17 12:23 AM  Result Value Ref Range   Sodium 141 135 - 145 mmol/L   Potassium 4.2 3.5 - 5.1 mmol/L  Chloride 109 101 - 111 mmol/L   CO2 22 22 - 32 mmol/L   Glucose, Bld 188 (H) 65 - 99 mg/dL   BUN 7 6 - 20 mg/dL   Creatinine, Ser 0.93 0.61 - 1.24 mg/dL   Calcium 9.6 8.9 - 10.3 mg/dL   Total Protein 7.0 6.5 - 8.1 g/dL   Albumin 4.2 3.5 - 5.0 g/dL   AST 20 15 - 41 U/L   ALT 16 (L) 17 - 63 U/L   Alkaline Phosphatase 51 38 - 126 U/L   Total Bilirubin 0.7 0.3 - 1.2 mg/dL   GFR calc non Af Amer >60 >60 mL/min   GFR calc Af Amer >60 >60 mL/min    Comment: (NOTE) The eGFR has been calculated using the CKD EPI equation. This calculation has not been validated in all clinical situations. eGFR's persistently <60 mL/min signify possible Chronic Kidney Disease.    Anion gap 10 5 - 15    Comment: Performed at [facility] [address], [place] [address]  CBC     Status: None   Collection Time: 08/08/17 12:23 AM  Result Value Ref Range   WBC 6.7 4.0 - 10.5 K/uL   RBC 5.18 4.22 - 5.81 MIL/uL   Hemoglobin 15.1 13.0 - 17.0 g/dL   HCT 44.4 39.0 - 52.0 %   MCV 85.7 78.0 - 100.0 fL   MCH 29.2 26.0 - 34.0 pg   MCHC 34.0 30.0 - 36.0 g/dL   RDW 12.2 11.5 - 15.5 %   Platelets 235 150 - 400 K/uL    Comment: Performed at [facility], [place] [address] [place], [place] [address]  Hemoglobin A1c     Status: None   Collection Time: 08/08/17 12:23 AM  Result Value Ref Range   Hgb A1c MFr Bld 4.8 4.8 - 5.6 %    Comment: (NOTE) Pre diabetes:          5.7%-6.4% Diabetes:              >6.4% Glycemic control for   <7.0% adults with diabetes    Mean Plasma Glucose 91.06 mg/dL    Comment: Performed at [facility] [address] [place], [place] [address]  T4, free     Status: Abnormal   Collection Time: 08/08/17 12:23 AM  Result  Value Ref Range   Free T4 1.20 (H) 0.61 - 1.12 ng/dL    Comment: (NOTE) Biotin ingestion may interfere with free T4 tests. If the results are inconsistent with the TSH level, previous test results, or the clinical presentation, then consider biotin interference. If needed, order repeat testing after stopping biotin. Performed at [facility], [place] [address] [place], [place] [address]     Ct Head Wo Contrast  Result Date: 08/07/2017 CLINICAL DATA:  Headache for 3 hours. EXAM: CT HEAD WITHOUT CONTRAST TECHNIQUE: Contiguous axial images were obtained from the base of the skull through the vertex without intravenous contrast. COMPARISON:  12/02/2016 FINDINGS: Brain: The ventricles are normal in size and configuration. No extra-axial fluid collections are identified. The gray-white differentiation is maintained. No CT findings for acute hemispheric infarction or intracranial hemorrhage. There is a stable pituitary lesion on the left side measuring a maximum of 12 mm. No change when compared to the prior CT scan. The brainstem and cerebellum are normal. Vascular: No hyperdense vessels or obvious aneurysm. Skull: No acute skull fracture.  No bone lesion. Sinuses/Orbits: The paranasal sinuses  and mastoid air cells are clear. The globes are intact. Other: No scalp lesions, laceration or hematoma. 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 No new/acute intracranial findings. Electronically Signed   By: Marijo Sanes M.D.   On: 08/07/2017 17:16   Mr Angiogram Head Wo Contrast  Addendum Date: 08/08/2017   ADDENDUM REPORT: 08/08/2017 13:21 ADDENDUM: Catheter angiogram demonstrates no vascular component to the lesion. No evidence for origin from the LEFT ICA. Consideration should be given to nonvascular intrasellar lesion. Given the T1 shortening and peripheral calcification, and ovoid to spherical shape, Rathke's cleft cyst would be a consideration. Eccentric macroadenoma with mucinous  secretion or blood products is less favored. Craniopharyngioma is unlikely. Electronically Signed   By: Staci Righter M.D.   On: 08/08/2017 13:21   Result Date: 08/08/2017 CLINICAL DATA:  Headaches with reported bitemporal visual loss. Previous history of severe headaches. EXAM: MRI HEAD WITHOUT AND WITH CONTRAST MRA HEAD WITHOUT CONTRAST TECHNIQUE: Multiplanar, multiecho pulse sequences of the brain and surrounding structures were obtained without and with intravenous contrast. Angiographic images of the head were obtained using MRA technique without contrast. CONTRAST:  2m MULTIHANCE GADOBENATE DIMEGLUMINE 529 MG/ML IV SOLN COMPARISON:  CT head 08/07/2017.  CT head 12/02/2016. FINDINGS: MRI HEAD FINDINGS Brain: No acute stroke, acute hemorrhage, intra-axial mass lesion, hydrocephalus, or extra-axial fluid. Normal for age cerebral volume. No white matter disease. 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 There is slight heterogeneity of signal within the lesion on T1 and FLAIR imaging, consistent with swirling blood. The lesion is roughly spherical, 12 mm diameter. The lesion does not enhance in conjunction with pituitary. Pituitary stalk is deviated LEFT-to-RIGHT. The lesion is outside the pituitary gland, but does accumulate contrast on post infusion imaging. The lesion does demonstrate mild mass effect on the pre chiasmatic intracranial optic nerves but the chiasm appears normally located, and surrounded by CSF. Vascular: Carotid, basilar, vertebral flow voids are preserved. The intrasellar structure appears to have an arterial signature, with flow void similar to the carotid artery. Skull and upper cervical spine: Normal marrow signal. Sinuses/Orbits: Negative. Other: Compared with prior CT, similar appearance. MRA HEAD FINDINGS The internal carotid arteries are widely patent. The basilar artery is widely patent with  vertebrals codominant. There is no intracranial stenosis. There is T1 shortening associated with abnormal flow related enhancement of an intrasellar 12 mm aneurysm arising from the superior cavernous segment of the LEFT internal carotid artery. There appears to have a narrow neck based on examination is series 12, image 60 axial source image. 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 The pituitary gland is displaced LEFT-to-RIGHT, in the stalk is stretched over the dome of the aneurysm. There is no intrinsic pituitary tumor. Mild mass effect on the pre chiasmatic intracranial optic nerves, but the chiasm appears normally located. Neuro-Interventional Radiology consultation is suggested to evaluate the appropriateness of potential treatment. Non-emergent evaluation can be arranged by calling [phone] usual hours. Emergency evaluation can be requested by paging [phone] Electronically Signed: By: JStaci RighterM.D. On: 08/07/2017 18:50   Mr BJeri CosAnd Wo Contrast  Addendum Date: 08/08/2017   ADDENDUM REPORT: 08/08/2017 13:21 ADDENDUM: Catheter angiogram demonstrates no vascular component to the lesion. No evidence for origin from the LEFT ICA. Consideration should be given to nonvascular intrasellar lesion. Given the T1 shortening  and peripheral calcification, and ovoid to spherical shape, Rathke's cleft cyst would be a consideration. Eccentric macroadenoma with mucinous secretion or blood products is less favored. Craniopharyngioma is unlikely. Electronically Signed   By: Staci Righter M.D.   On: 08/08/2017 13:21   Result Date: 08/08/2017 CLINICAL DATA:  Headaches with reported bitemporal visual loss. Previous history of severe headaches. EXAM: MRI HEAD WITHOUT AND WITH CONTRAST MRA HEAD WITHOUT CONTRAST TECHNIQUE: Multiplanar, multiecho pulse sequences of the brain and surrounding structures were obtained without  and with intravenous contrast. Angiographic images of the head were obtained using MRA technique without contrast. CONTRAST:  109m MULTIHANCE GADOBENATE DIMEGLUMINE 529 MG/ML IV SOLN COMPARISON:  CT head 08/07/2017.  CT head 12/02/2016. FINDINGS: MRI HEAD FINDINGS Brain: No acute stroke, acute hemorrhage, intra-axial mass lesion, hydrocephalus, or extra-axial fluid. Normal for age cerebral volume. No white matter disease. 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 There is slight heterogeneity of signal within the lesion on T1 and FLAIR imaging, consistent with swirling blood. The lesion is roughly spherical, 12 mm diameter. The lesion does not enhance in conjunction with pituitary. Pituitary stalk is deviated LEFT-to-RIGHT. The lesion is outside the pituitary gland, but does accumulate contrast on post infusion imaging. The lesion does demonstrate mild mass effect on the pre chiasmatic intracranial optic nerves but the chiasm appears normally located, and surrounded by CSF. Vascular: Carotid, basilar, vertebral flow voids are preserved. The intrasellar structure appears to have an arterial signature, with flow void similar to the carotid artery. Skull and upper cervical spine: Normal marrow signal. Sinuses/Orbits: Negative. Other: Compared with prior CT, similar appearance. MRA HEAD FINDINGS The internal carotid arteries are widely patent. The basilar artery is widely patent with vertebrals codominant. There is no intracranial stenosis. There is T1 shortening associated with abnormal flow related enhancement of an intrasellar 12 mm aneurysm arising from the superior cavernous segment of the LEFT internal carotid artery. There appears to have a narrow neck based on examination is series 12, image 60 axial source image. 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 The pituitary gland is displaced LEFT-to-RIGHT, in the stalk is stretched over the dome of the aneurysm. There is no intrinsic pituitary tumor. Mild mass effect on the pre chiasmatic intracranial optic nerves, but the chiasm appears normally located. Neuro-Interventional Radiology consultation is suggested to evaluate the appropriateness of potential treatment. Non-emergent evaluation can be arranged by calling [phone] usual hours. Emergency evaluation can be requested by paging [phone] Electronically Signed: By: JStaci RighterM.D. On: 08/07/2017 18:50    Review of Systems - Otherwise unremarkable except for history of headaches.   Blood pressure 123/87, pulse 81, temperature 97.9 F (36.6 C), temperature source Oral, resp. rate 14, height 5' 10"  (1.778 m), weight 74.2 kg (163 lb 9.3 oz), SpO2 97 %. Physical Exam  Constitutional: He is oriented to person, place, and time. He appears well-developed and well-nourished.  HENT:  Head: Normocephalic and atraumatic.  Eyes: EOM are normal. Pupils are equal, round, and reactive to light.  Bitemporal hemianopsia on confrontational exam, incomplete, left slightly worse than right  Neck: Trachea normal, normal range of motion and phonation normal. Neck supple. No Brudzinski's sign and no Kernig's sign noted.  Cardiovascular: Normal rate and regular rhythm.  Respiratory: Effort normal and breath sounds normal.  GI: Soft. Normal appearance. There is no  tenderness.  Neurological: He is alert and oriented to person, place, and time. He has normal strength and normal reflexes. A cranial nerve deficit is present. No sensory deficit. GCS eye subscore is 4. GCS verbal subscore is 5. GCS motor subscore is 6.  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  No pronator drift.  Psychiatric: He has a normal mood and affect. His speech is normal and behavior is normal. Judgment and thought content normal. Cognition and memory are normal.     Assessment/Plan: The patient has a pituitary mass, which has been known for many years, previously evaluated in [place] and is now larger.  Endocrine workup is currently not known.  Prolactin level pending.  Somatomedin C ordered.  The patient complains of sudden onset of headache and bitemporal vision loss.  This may reflect hemorrhage into pituitary tumor, but surprisingly, there does not appear to be significant chiasmatic compression on recent MRI.  I have started the patient on high dose decadron at 10 mg Q 6 hours and will see if his headache and vision improves.  If it does not, I have recommended proceeding with transsphenoidal resection of pituitary tumor tomorrow to decompress his optic apparatus. Angiography clearly demonstrates that this mass is not an aneurysm.  Patient wishes to proceed with surgery if his vision has not improved with steroids overnight.   Peggyann Shoals, MD 08/08/2017, 4:00 PM

## 2017-08-08 NOTE — Progress Notes (Signed)
STROKE TEAM PROGRESS NOTE   SUBJECTIVE (INTERVAL HISTORY) His father and other families are at the bedside.  Overall he feels his condition is unchanged. Still complains of HA, now 7/10 from 9/10 yesterday. Eating breakfast in bed, no acute distress. DSA showed normal angiogram.    OBJECTIVE Temp:  [97.6 F (36.4 C)-98.8 F (37.1 C)] 97.9 F (36.6 C) (02/14 1200) Pulse Rate:  [58-97] 81 (02/14 1200) Cardiac Rhythm: Normal sinus rhythm (02/14 1200) Resp:  [13-28] 14 (02/14 1200) BP: (101-136)/(60-94) 123/87 (02/14 1200) SpO2:  [96 %-100 %] 97 % (02/14 1200) Weight:  [160 lb (72.6 kg)-163 lb 9.3 oz (74.2 kg)] 163 lb 9.3 oz (74.2 kg) (02/14 0011)  No results for input(s): GLUCAP in the last 168 hours. Recent Labs  Lab 08/07/17 2042 08/08/17 0023  NA 137 141  K 4.0 4.2  CL 106 109  CO2 19* 22  GLUCOSE 191* 188*  BUN 8 7  CREATININE 0.96 0.93  CALCIUM 9.4 9.6  MG  --  2.1  PHOS  --  2.7   Recent Labs  Lab 08/08/17 0023  AST 20  ALT 16*  ALKPHOS 51  BILITOT 0.7  PROT 7.0  ALBUMIN 4.2   Recent Labs  Lab 08/07/17 2042 08/08/17 0023  WBC 10.0 6.7  NEUTROABS 9.3*  --   HGB 15.4 15.1  HCT 45.1 44.4  MCV 86.4 85.7  PLT 194 235   No results for input(s): CKTOTAL, CKMB, CKMBINDEX, TROPONINI in the last 168 hours. Recent Labs    08/08/17 0023  LABPROT 13.4  INR 1.03   No results for input(s): COLORURINE, LABSPEC, PHURINE, GLUCOSEU, HGBUR, BILIRUBINUR, KETONESUR, PROTEINUR, UROBILINOGEN, NITRITE, LEUKOCYTESUR in the last 72 hours.  Invalid input(s): APPERANCEUR  No results found for: CHOL, TRIG, HDL, CHOLHDL, VLDL, LDLCALC No results found for: HGBA1C No results found for: LABOPIA, COCAINSCRNUR, LABBENZ, AMPHETMU, THCU, LABBARB  No results for input(s): ETH in the last 168 hours.  I have personally reviewed the radiological images below and agree with the radiology interpretations.  Ct Head Wo Contrast  Result Date: 08/07/2017 CLINICAL DATA:  Headache for 3  hours. EXAM: CT HEAD WITHOUT CONTRAST TECHNIQUE: Contiguous axial images were obtained from the base of the skull through the vertex without intravenous contrast. COMPARISON:  12/02/2016 FINDINGS: Brain: The ventricles are normal in size and configuration. No extra-axial fluid collections are identified. The gray-white differentiation is maintained. No CT findings for acute hemispheric infarction or intracranial hemorrhage. There is a stable pituitary lesion on the left side measuring a maximum of 12 mm. No change when compared to the prior CT scan. The brainstem and cerebellum are normal. Vascular: No hyperdense vessels or obvious aneurysm. Skull: No acute skull fracture.  No bone lesion. Sinuses/Orbits: The paranasal sinuses and mastoid air cells are clear. The globes are intact. Other: No scalp lesions, laceration or hematoma. 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 No new/acute intracranial findings. Electronically Signed   By: Marijo Sanes M.D.   On: 08/07/2017 17:16   Mr Angiogram Head Wo Contrast  Result Date: 08/07/2017 CLINICAL DATA:  Headaches with reported bitemporal visual loss. Previous history of severe headaches. EXAM: MRI HEAD WITHOUT AND WITH CONTRAST MRA HEAD WITHOUT CONTRAST TECHNIQUE: Multiplanar, multiecho pulse sequences of the brain and surrounding structures were obtained without and with intravenous contrast. Angiographic images of the head were obtained using MRA technique without contrast. CONTRAST:  51mL MULTIHANCE GADOBENATE DIMEGLUMINE 529 MG/ML IV SOLN COMPARISON:  CT head 08/07/2017.  CT head 12/02/2016. FINDINGS: MRI HEAD FINDINGS Brain: No acute stroke, acute hemorrhage, intra-axial mass lesion, hydrocephalus, or extra-axial fluid. Normal for age cerebral volume. No white matter disease. 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 There is slight heterogeneity of signal  within the lesion on T1 and FLAIR imaging, consistent with swirling blood. The lesion is roughly spherical, 12 mm diameter. The lesion does not enhance in conjunction with pituitary. Pituitary stalk is deviated LEFT-to-RIGHT. The lesion is outside the pituitary gland, but does accumulate contrast on post infusion imaging. The lesion does demonstrate mild mass effect on the pre chiasmatic intracranial optic nerves but the chiasm appears normally located, and surrounded by CSF. Vascular: Carotid, basilar, vertebral flow voids are preserved. The intrasellar structure appears to have an arterial signature, with flow void similar to the carotid artery. Skull and upper cervical spine: Normal marrow signal. Sinuses/Orbits: Negative. Other: Compared with prior CT, similar appearance. MRA HEAD FINDINGS The internal carotid arteries are widely patent. The basilar artery is widely patent with vertebrals codominant. There is no intracranial stenosis. There is T1 shortening associated with abnormal flow related enhancement of an intrasellar 12 mm aneurysm arising from the superior cavernous segment of the LEFT internal carotid artery. There appears to have a narrow neck based on examination is series 12, image 60 axial source image. 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 The pituitary gland is displaced LEFT-to-RIGHT, in the stalk is stretched over the dome of the aneurysm. There is no intrinsic pituitary tumor. Mild mass effect on the pre chiasmatic intracranial optic nerves, but the chiasm appears normally located. Neuro-Interventional Radiology consultation is suggested to evaluate the appropriateness of potential treatment. Non-emergent evaluation can be arranged by calling [phone] during usual hours. Emergency evaluation can be requested by paging [phone]. Electronically Signed   By: Staci Righter M.D.   On: 08/07/2017 18:50   Mr  Jeri Cos And Wo Contrast  Result Date: 08/07/2017 CLINICAL DATA:  Headaches with reported bitemporal visual loss. Previous history of severe headaches. EXAM: MRI HEAD WITHOUT AND WITH CONTRAST MRA HEAD WITHOUT CONTRAST TECHNIQUE: Multiplanar, multiecho pulse sequences of the brain and surrounding structures were obtained without and with intravenous contrast. Angiographic images of the head were obtained using MRA technique without contrast. CONTRAST:  32mL MULTIHANCE GADOBENATE DIMEGLUMINE 529 MG/ML IV SOLN COMPARISON:  CT head 08/07/2017.  CT head 12/02/2016. FINDINGS: MRI HEAD FINDINGS Brain: No acute stroke, acute hemorrhage, intra-axial mass lesion, hydrocephalus, or extra-axial fluid. Normal for age cerebral volume. No white matter disease. 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 There is slight heterogeneity of signal within the lesion on T1 and FLAIR imaging, consistent with swirling blood. The lesion is roughly spherical, 12 mm diameter. The lesion does not enhance in conjunction with pituitary. Pituitary stalk is deviated LEFT-to-RIGHT. The lesion is outside the pituitary gland, but does accumulate contrast on post infusion imaging. The lesion does demonstrate mild mass effect on the pre chiasmatic intracranial optic nerves but the chiasm appears normally located, and surrounded by CSF. Vascular: Carotid, basilar, vertebral flow voids are preserved. The intrasellar structure appears to have an arterial signature, with flow void similar to the carotid artery. Skull and upper cervical spine: Normal marrow signal. Sinuses/Orbits: Negative. Other: Compared with prior CT, similar  appearance. MRA HEAD FINDINGS The internal carotid arteries are widely patent. The basilar artery is widely patent with vertebrals codominant. There is no intracranial stenosis. 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 There appears to have a narrow neck based on examination is series 12, image 60 axial source image. 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 The pituitary gland is displaced LEFT-to-RIGHT, in the stalk is stretched over the dome of the aneurysm. There is no intrinsic pituitary tumor. Mild mass effect on the pre chiasmatic intracranial optic nerves, but the chiasm appears normally located. Neuro-Interventional Radiology consultation is suggested to evaluate the appropriateness of potential treatment. Non-emergent evaluation can be arranged by calling [phone] during usual hours. Emergency evaluation can be requested by paging [phone]. Electronically Signed   By: Staci Righter M.D.   On: 08/07/2017 18:50   DSA -  1.No angiographic evidence of an aneurysm in the pituitary region. No evidence of contrast blush or AV shunting noted.    PHYSICAL EXAM  Temp:  [97.6 F (36.4 C)-98.8 F (37.1 C)] 97.9 F (36.6 C) (02/14 1200) Pulse Rate:  [58-97] 81 (02/14 1200) Resp:  [13-28] 14 (02/14 1200) BP: (101-136)/(60-94) 123/87 (02/14 1200) SpO2:  [96 %-100 %] 97 % (02/14 1200) Weight:  [160 lb (72.6 kg)-163 lb 9.3 oz (74.2 kg)] 163 lb 9.3 oz (74.2 kg) (02/14 0011)  General - Well nourished, well developed, in no apparent distress.  Ophthalmologic - fundi not visualized due to noncooperation.  Cardiovascular - Regular rate and rhythm with no murmur.  Mental Status -  Level of arousal and orientation to time, place, and person were intact. Language including expression, naming, repetition, comprehension was assessed and found intact. Attention span and concentration were normal. Fund of Knowledge was assessed and was intact.  Cranial Nerves II - XII - II - Visual field exam showed  bilateral temporal partial hemianopia. III, IV, VI - Extraocular movements intact. V - Facial sensation intact bilaterally. VII - Facial movement intact bilaterally. VIII - Hearing & vestibular intact bilaterally. X - Palate elevates symmetrically. XI - Chin turning & shoulder shrug intact bilaterally. XII - Tongue protrusion intact.  Motor Strength - The patient's strength was normal in all extremities except limited exam on the right leg due to s/p angiogram and pronator drift was absent.  Bulk was normal and fasciculations were absent.   Motor Tone - Muscle tone was assessed at the neck and appendages and was normal.  Reflexes - The patient's reflexes were symmetrical in all extremities and he had no pathological reflexes.  Sensory - Light touch, temperature/pinprick were assessed and were symmetrical.    Coordination - The patient had normal movements in the hands and feet with no ataxia or dysmetria.  Tremor was absent.  Gait and Station - deferred.   ASSESSMENT/PLAN Mr. [name] is a 27 y.o. male with history of HA and pituitary tumor diagnosed 7 years ago in [place] admitted for HA and visual field loss.   Large pituitary tumor  Resultant HA and bi-temporal partial hemianopia  MRI 12 mm pituitary tumor more at left seller region - growing from 35mm previously as per pt  MRA  Unremarkable  DSA no ICA aneurysm  SCDs for VTE prophylaxis  Diet Heart Room service appropriate? Yes; Fluid consistency: Thin   On decadron 4mg   Q8h  Recommend neurosurgery consultation ASAP for tumor management.  Therapy recommendations:  pending  Disposition:  pending  Low TSH level  TSH 0.170  Needs to repeat TSH and check on FT4  Defer to primary team and neurosurgery at this time  Elevated glucose  CBG 190  Close monitoring  Check A1C level  Defer to primary team and neurosurgery  Other [facility] day # 1  Neurology will sign off. Please call  with questions. No neuro follow up needed at this time. Thanks for the consult.   Rosalin Hawking, MD PhD Stroke Neurology 08/08/2017 12:11 PM    To contact Stroke Continuity provider, please refer to [email or web address] After hours, contact General Neurology

## 2017-08-08 NOTE — Consult Note (Signed)
Chief Complaint: L ICA aneurysm   Referring Physician:Dr. Karena Addison Aroor  Supervising Physician: Luanne Bras  Patient Status: [facility] - In-pt  HPI: [name] is a 27 y.o. male with a history of a pituitary adenoma that was diagnosed 7 years ago in [place].  Since then he has had some migraines intermittently and manageable headaches.  However, yesterday, all of the sudden, his HA went from a 4 to a 9.  His vision changed and he could no longer see in his periphery.  He came to the ED and was evaluated.  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  NIR was consulted for cerebral angiogram.  Past Medical History:  Past Medical History:  Diagnosis Date  . Headache     Past Surgical History: History reviewed. No pertinent surgical history.  Family History:  Family History  Problem Relation Age of Onset  . Diabetes Mellitus I Sister   . CAD Other     Social History:  reports that  has never smoked. he has never used smokeless tobacco. He reports that he drinks about 1.2 oz of alcohol per week. He reports that he does not use drugs.  Allergies:  Allergies  Allergen Reactions  . Codeine Nausea And Vomiting    Medications: Medications reviewed in epic  Please HPI for pertinent positives, otherwise complete 10 system ROS negative.  Mallampati Score: MD Evaluation Airway: WNL Heart: WNL Abdomen: WNL Chest/ Lungs: WNL ASA  Classification: 2 Mallampati/Airway Score: One  Physical Exam: BP 127/74 (BP Location: Left Arm)   Pulse 73   Temp 97.6 F (36.4 C) (Oral)   Resp 17   Ht 5' 10"  (1.778 m)   Wt 163 lb 9.3 oz (74.2 kg)   SpO2 98%   BMI 23.47 kg/m  Body mass index is 23.47 kg/m. General: pleasant, WD, WN white male who is laying in bed in NAD HEENT: head is normocephalic, atraumatic.  Sclera are noninjected.  PERRL.  Ears and nose without any masses or lesions.  Mouth is pink and moist Heart: regular, rate, and rhythm.  Normal s1,s2. No obvious  murmurs, gallops, or rubs noted.  Palpable radial pulses bilaterally Lungs: CTAB, no wheezes, rhonchi, or rales noted.  Respiratory effort nonlabored Abd: soft, NT, ND, +BS, no masses, hernias, or organomegaly Psych: A&Ox3 with an appropriate affect.   Labs: Results for orders placed or performed during the hospital encounter of 08/07/17 (from the past 48 hour(s))  CBC with Differential     Status: Abnormal   Collection Time: 08/07/17  8:42 PM  Result Value Ref Range   WBC 10.0 4.0 - 10.5 K/uL   RBC 5.22 4.22 - 5.81 MIL/uL   Hemoglobin 15.4 13.0 - 17.0 g/dL   HCT 45.1 39.0 - 52.0 %   MCV 86.4 78.0 - 100.0 fL   MCH 29.5 26.0 - 34.0 pg   MCHC 34.1 30.0 - 36.0 g/dL   RDW 12.5 11.5 - 15.5 %   Platelets 194 150 - 400 K/uL   Neutrophils Relative % 93 %   Neutro Abs 9.3 (H) 1.7 - 7.7 K/uL   Lymphocytes Relative 6 %   Lymphs Abs 0.6 (L) 0.7 - 4.0 K/uL   Monocytes Relative 1 %   Monocytes Absolute 0.1 0.1 - 1.0 K/uL   Eosinophils Relative 0 %   Eosinophils Absolute 0.0 0.0 - 0.7 K/uL   Basophils Relative 0 %   Basophils Absolute 0.0 0.0 - 0.1 K/uL  Comment: Performed at [facility], [place] [address], [place], [place] [address]  Basic metabolic panel     Status: Abnormal   Collection Time: 08/07/17  8:42 PM  Result Value Ref Range   Sodium 137 135 - 145 mmol/L   Potassium 4.0 3.5 - 5.1 mmol/L   Chloride 106 101 - 111 mmol/L   CO2 19 (L) 22 - 32 mmol/L   Glucose, Bld 191 (H) 65 - 99 mg/dL   BUN 8 6 - 20 mg/dL   Creatinine, Ser 0.96 0.61 - 1.24 mg/dL   Calcium 9.4 8.9 - 10.3 mg/dL   GFR calc non Af Amer >60 >60 mL/min   GFR calc Af Amer >60 >60 mL/min    Comment: (NOTE) The eGFR has been calculated using the CKD EPI equation. This calculation has not been validated in all clinical situations. eGFR's persistently <60 mL/min signify possible Chronic Kidney Disease.    Anion gap 12 5 - 15    Comment: Performed at [facility] [address], [place], [place]  [address]  MRSA PCR Screening     Status: None   Collection Time: 08/08/17 12:00 AM  Result Value Ref Range   MRSA by PCR NEGATIVE NEGATIVE    Comment:        The GeneXpert MRSA Assay (FDA approved for NASAL specimens only), is one component of a comprehensive MRSA colonization surveillance program. It is not intended to diagnose MRSA infection nor to guide or monitor treatment for MRSA infections. Performed at [facility], [place] [address], [place], [place] [address]   Protime-INR     Status: None   Collection Time: 08/08/17 12:23 AM  Result Value Ref Range   Prothrombin Time 13.4 11.4 - 15.2 seconds   INR 1.03     Comment: Performed at [facility] [address], [place], [place] [address]  APTT     Status: None   Collection Time: 08/08/17 12:23 AM  Result Value Ref Range   aPTT 29 24 - 36 seconds    Comment: Performed at [facility] [address], [place], [place] [address]  Magnesium     Status: None   Collection Time: 08/08/17 12:23 AM  Result Value Ref Range   Magnesium 2.1 1.7 - 2.4 mg/dL    Comment: Performed at [facility], [place] [address], [place], [place] [address]  Phosphorus     Status: None   Collection Time: 08/08/17 12:23 AM  Result Value Ref Range   Phosphorus 2.7 2.5 - 4.6 mg/dL    Comment: Performed at [facility] [address] [place],  [place] [address]  TSH     Status: Abnormal   Collection Time: 08/08/17 12:23 AM  Result Value Ref Range   TSH 0.170 (L) 0.350 - 4.500 uIU/mL    Comment: Performed by a 3rd Generation assay with a functional sensitivity of <=0.01 uIU/mL. Performed at [facility], [place] [address], [place], [place] [address]   Comprehensive metabolic panel     Status: Abnormal   Collection Time: 08/08/17 12:23 AM  Result Value Ref Range   Sodium 141 135 - 145 mmol/L   Potassium 4.2 3.5 - 5.1 mmol/L   Chloride 109 101 - 111 mmol/L   CO2 22 22 - 32 mmol/L   Glucose, Bld 188 (H) 65 - 99 mg/dL    BUN 7 6 - 20 mg/dL   Creatinine, Ser 0.93  0.61 - 1.24 mg/dL   Calcium 9.6 8.9 - 10.3 mg/dL   Total Protein 7.0 6.5 - 8.1 g/dL   Albumin 4.2 3.5 - 5.0 g/dL   AST 20 15 - 41 U/L   ALT 16 (L) 17 - 63 U/L   Alkaline Phosphatase 51 38 - 126 U/L   Total Bilirubin 0.7 0.3 - 1.2 mg/dL   GFR calc non Af Amer >60 >60 mL/min   GFR calc Af Amer >60 >60 mL/min    Comment: (NOTE) The eGFR has been calculated using the CKD EPI equation. This calculation has not been validated in all clinical situations. eGFR's persistently <60 mL/min signify possible Chronic Kidney Disease.    Anion gap 10 5 - 15    Comment: Performed at [facility] [address], [place] [address]  CBC     Status: None   Collection Time: 08/08/17 12:23 AM  Result Value Ref Range   WBC 6.7 4.0 - 10.5 K/uL   RBC 5.18 4.22 - 5.81 MIL/uL   Hemoglobin 15.1 13.0 - 17.0 g/dL   HCT 44.4 39.0 - 52.0 %   MCV 85.7 78.0 - 100.0 fL   MCH 29.2 26.0 - 34.0 pg   MCHC 34.0 30.0 - 36.0 g/dL   RDW 12.2 11.5 - 15.5 %   Platelets 235 150 - 400 K/uL    Comment: Performed at [facility], [address] [place], [place] [address]    Imaging: Ct Head Wo Contrast  Result Date: 08/07/2017 CLINICAL DATA:  Headache for 3 hours. EXAM: CT HEAD WITHOUT CONTRAST TECHNIQUE: Contiguous axial images were obtained from the base of the skull through the vertex without intravenous contrast. COMPARISON:  12/02/2016 FINDINGS: Brain: The ventricles are normal in size and configuration. No extra-axial fluid collections are identified. The gray-white differentiation is maintained. No CT findings for acute hemispheric infarction or intracranial hemorrhage. There is a stable pituitary lesion on the left side measuring a maximum of 12 mm. No change when compared to the prior CT scan. The brainstem and cerebellum are normal. Vascular: No hyperdense vessels or obvious aneurysm. Skull: No acute skull fracture.  No bone lesion. Sinuses/Orbits: The  paranasal sinuses and mastoid air cells are clear. The globes are intact. Other: No scalp lesions, laceration or hematoma. 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 No new/acute intracranial findings. Electronically Signed   By: Marijo Sanes M.D.   On: 08/07/2017 17:16   Mr Angiogram Head Wo Contrast  Result Date: 08/07/2017 CLINICAL DATA:  Headaches with reported bitemporal visual loss. Previous history of severe headaches. EXAM: MRI HEAD WITHOUT AND WITH CONTRAST MRA HEAD WITHOUT CONTRAST TECHNIQUE: Multiplanar, multiecho pulse sequences of the brain and surrounding structures were obtained without and with intravenous contrast. Angiographic images of the head were obtained using MRA technique without contrast. CONTRAST:  73m MULTIHANCE GADOBENATE DIMEGLUMINE 529 MG/ML IV SOLN COMPARISON:  CT head 08/07/2017.  CT head 12/02/2016. FINDINGS: MRI HEAD FINDINGS Brain: No acute stroke, acute hemorrhage, intra-axial mass lesion, hydrocephalus, or extra-axial fluid. Normal for age cerebral volume. No white matter disease. 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 There is slight heterogeneity of signal within the lesion on T1 and FLAIR imaging, consistent with swirling blood. The lesion is roughly spherical, 12 mm diameter. The lesion does not enhance in conjunction with pituitary. Pituitary stalk is deviated LEFT-to-RIGHT. The  lesion is outside the pituitary gland, but does accumulate contrast on post infusion imaging. The lesion does demonstrate mild mass effect on the pre chiasmatic intracranial optic nerves but the chiasm appears normally located, and surrounded by CSF. Vascular: Carotid, basilar, vertebral flow voids are preserved. The intrasellar structure appears to have an arterial signature, with flow void similar to the carotid artery. Skull and upper cervical spine: Normal marrow signal.  Sinuses/Orbits: Negative. Other: Compared with prior CT, similar appearance. MRA HEAD FINDINGS The internal carotid arteries are widely patent. The basilar artery is widely patent with vertebrals codominant. There is no intracranial stenosis. There is T1 shortening associated with abnormal flow related enhancement of an intrasellar 12 mm aneurysm arising from the superior cavernous segment of the LEFT internal carotid artery. There appears to have a narrow neck based on examination is series 12, image 60 axial source image. 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 The pituitary gland is displaced LEFT-to-RIGHT, in the stalk is stretched over the dome of the aneurysm. There is no intrinsic pituitary tumor. Mild mass effect on the pre chiasmatic intracranial optic nerves, but the chiasm appears normally located. Neuro-Interventional Radiology consultation is suggested to evaluate the appropriateness of potential treatment. Non-emergent evaluation can be arranged by calling [phone] during usual hours. Emergency evaluation can be requested by paging [phone]. Electronically Signed   By: Staci Righter M.D.   On: 08/07/2017 18:50   Mr Jeri Cos And Wo Contrast  Result Date: 08/07/2017 CLINICAL DATA:  Headaches with reported bitemporal visual loss. Previous history of severe headaches. EXAM: MRI HEAD WITHOUT AND WITH CONTRAST MRA HEAD WITHOUT CONTRAST TECHNIQUE: Multiplanar, multiecho pulse sequences of the brain and surrounding structures were obtained without and with intravenous contrast. Angiographic images of the head were obtained using MRA technique without contrast. CONTRAST:  21m MULTIHANCE GADOBENATE DIMEGLUMINE 529 MG/ML IV SOLN COMPARISON:  CT head 08/07/2017.  CT head 12/02/2016. FINDINGS: MRI HEAD FINDINGS Brain: No acute stroke, acute hemorrhage, intra-axial mass lesion, hydrocephalus, or extra-axial fluid. Normal  for age cerebral volume. No white matter disease. 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 There is slight heterogeneity of signal within the lesion on T1 and FLAIR imaging, consistent with swirling blood. The lesion is roughly spherical, 12 mm diameter. The lesion does not enhance in conjunction with pituitary. Pituitary stalk is deviated LEFT-to-RIGHT. The lesion is outside the pituitary gland, but does accumulate contrast on post infusion imaging. The lesion does demonstrate mild mass effect on the pre chiasmatic intracranial optic nerves but the chiasm appears normally located, and surrounded by CSF. Vascular: Carotid, basilar, vertebral flow voids are preserved. The intrasellar structure appears to have an arterial signature, with flow void similar to the carotid artery. Skull and upper cervical spine: Normal marrow signal. Sinuses/Orbits: Negative. Other: Compared with prior CT, similar appearance. MRA HEAD FINDINGS The internal carotid arteries are widely patent. The basilar artery is widely patent with vertebrals codominant. There is no intracranial stenosis. There is T1 shortening associated with abnormal flow related enhancement of an intrasellar 12 mm aneurysm arising from the superior cavernous segment of the LEFT internal carotid artery. There appears to have a narrow neck based on examination is series 12, image 60 axial source image. 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 The pituitary gland is displaced LEFT-to-RIGHT,  in the stalk is stretched over the dome of the aneurysm. There is no intrinsic pituitary tumor. Mild mass effect on the pre chiasmatic intracranial optic nerves, but the chiasm appears normally located. Neuro-Interventional Radiology consultation is suggested to evaluate the appropriateness of potential treatment.  Non-emergent evaluation can be arranged by calling [phone] during usual hours. Emergency evaluation can be requested by paging [phone]. Electronically Signed   By: Staci Righter M.D.   On: 08/07/2017 18:50    Assessment/Plan 1. L ICA aneurysm  Will plan to proceed with cerebral angiogram to better look at this finding to determine if this will need further intervention or observation.  The patient is agreeable to proceed. Risks and benefits of cerebral angiogram were discussed with the patient including, but not limited to bleeding, infection, vascular injury or contrast induced renal failure.  This interventional procedure involves the use of X-rays and because of the nature of the planned procedure, it is possible that we will have prolonged use of X-ray fluoroscopy.  Potential radiation risks to you include (but are not limited to) the following: - A slightly elevated risk for cancer  several years later in life. This risk is typically less than 0.5% percent. This risk is low in comparison to the normal incidence of human cancer, which is 33% for women and 50% for men according to the [employer] [place]. - Radiation induced injury can include skin redness, resembling a rash, tissue breakdown / ulcers and hair loss (which can be temporary or permanent).   The likelihood of either of these occurring depends on the difficulty of the procedure and whether you are sensitive to radiation due to previous procedures, disease, or genetic conditions.   IF your procedure requires a prolonged use of radiation, you will be notified and given written instructions for further action.  It is your responsibility to monitor the irradiated area for the 2 weeks following the procedure and to notify your physician if you are concerned that you have suffered a radiation induced injury.    All of the patient's questions were answered, patient is agreeable to proceed.  Consent signed and in  chart.   Thank you for this interesting consult.  I greatly enjoyed meeting [facility] and look forward to participating in their care.  A copy of this report was sent to the requesting provider on this date.  Electronically Signed: Henreitta Cea 08/08/2017, 8:24 AM   I spent a total of 40 Minutes    in face to face in clinical consultation, greater than 50% of which was counseling/coordinating care for L ICA aneurysm

## 2017-08-08 NOTE — Progress Notes (Signed)
Interventional radiology called and stated that [name] is on the schedule for his angiogram at 0800.  Patient made aware and remains neurologically intact.Marland Kitchen

## 2017-08-08 NOTE — Care Management Note (Signed)
Case Management Note  Patient Details  Name: [name] MRN: [id number] Date of Birth: 07/14/1990  Subjective/Objective:  From home alone, has medical history of headaches thought to be migraine, pituitary mass into 7 years presents with sudden vision loss and headache.                    Action/Plan: NCM will follow for transition of care needs.   Expected Discharge Date:  08/10/17               Expected Discharge Plan:     In-House Referral:     Discharge planning Services  CM Consult  Post Acute Care Choice:    Choice offered to:     DME Arranged:    DME Agency:     HH Arranged:    HH Agency:     Status of Service:  In process, will continue to follow  If discussed at Long Length of Stay Meetings, dates discussed:    Additional Comments:  Zenon Mayo, RN 08/08/2017, 3:58 PM

## 2017-08-08 NOTE — Procedures (Signed)
S/P  4 vessel cerebral cerebral arteriogram RT CFA approach. Findings. 1.No angiographic evidence of an aneurysm in the pituitary region. No evidence of contrast blush or AV shunting noted.

## 2017-08-08 NOTE — Sedation Documentation (Signed)
6 Fr. angioseal to right groin 

## 2017-08-09 ENCOUNTER — Inpatient Hospital Stay (HOSPITAL_COMMUNITY): Payer: 59 | Admitting: Anesthesiology

## 2017-08-09 ENCOUNTER — Encounter (HOSPITAL_COMMUNITY)
Admission: EM | Disposition: A | Discharge status: Home / Self Care | Payer: Self-pay | Source: Home / Self Care | Attending: Neurosurgery

## 2017-08-09 ENCOUNTER — Inpatient Hospital Stay (HOSPITAL_COMMUNITY): Payer: 59

## 2017-08-09 ENCOUNTER — Encounter (HOSPITAL_COMMUNITY): Payer: Self-pay | Admitting: Certified Registered Nurse Anesthetist

## 2017-08-09 DIAGNOSIS — D352 Benign neoplasm of pituitary gland: Secondary | ICD-10-CM | POA: Diagnosis present

## 2017-08-09 DIAGNOSIS — R739 Hyperglycemia, unspecified: Secondary | ICD-10-CM

## 2017-08-09 HISTORY — PX: CRANIOTOMY: SHX93

## 2017-08-09 HISTORY — PX: TRANSNASAL APPROACH: SHX6149

## 2017-08-09 LAB — BASIC METABOLIC PANEL
Anion gap: 11 (ref 5–15)
BUN: 10 mg/dL (ref 6–20)
CO2: 24 mmol/L (ref 22–32)
Calcium: 9.5 mg/dL (ref 8.9–10.3)
Chloride: 105 mmol/L (ref 101–111)
Creatinine, Ser: 0.88 mg/dL (ref 0.61–1.24)
GFR calc Af Amer: >60 mL/min (ref >60)
GFR calc non Af Amer: >60 mL/min (ref >60)
Glucose, Bld: 128 mg/dL — ABNORMAL HIGH (ref 65–99)
Potassium: 4.2 mmol/L (ref 3.5–5.1)
Sodium: 140 mmol/L (ref 135–145)

## 2017-08-09 LAB — TYPE AND SCREEN
ABO/RH(D): O POS
Antibody Screen: NEGATIVE

## 2017-08-09 LAB — SURGICAL PCR SCREEN
MRSA, PCR: NEGATIVE
Staphylococcus aureus: NEGATIVE

## 2017-08-09 LAB — CBC
HCT: 42.3 % (ref 39.0–52.0)
Hemoglobin: 14 g/dL (ref 13.0–17.0)
MCH: 29 pg (ref 26.0–34.0)
MCHC: 33.1 g/dL (ref 30.0–36.0)
MCV: 87.8 fL (ref 78.0–100.0)
Platelets: 222 10*3/uL (ref 150–400)
RBC: 4.82 MIL/uL (ref 4.22–5.81)
RDW: 12.7 % (ref 11.5–15.5)
WBC: 13.1 10*3/uL — ABNORMAL HIGH (ref 4.0–10.5)

## 2017-08-09 LAB — CORTISOL-AM, BLOOD: Cortisol - AM: 0.6 ug/dL — ABNORMAL LOW (ref 6.7–22.6)

## 2017-08-09 LAB — ABO/RH: ABO/RH(D): O POS

## 2017-08-09 LAB — PROLACTIN: Prolactin: 5.2 ng/mL (ref 4.0–15.2)

## 2017-08-09 SURGERY — CRANIOTOMY HYPOPHYSECTOMY TRANSNASAL APPROACH
Anesthesia: General

## 2017-08-09 MED ORDER — OXYCODONE HCL 5 MG/5ML PO SOLN: 5.0000 mg | Freq: Once | ORAL | Status: DC | PRN

## 2017-08-09 MED ORDER — SENNOSIDES-DOCUSATE SODIUM 8.6-50 MG PO TABS
1.0000 | ORAL_TABLET | Freq: Every evening | ORAL | Status: DC | PRN
  Administered 2017-08-10: 1 via ORAL
  Filled 2017-08-09: qty 1

## 2017-08-09 MED ORDER — CEFAZOLIN SODIUM-DEXTROSE 2-4 GM/100ML-% IV SOLN
2.0000 g | INTRAVENOUS | Status: AC
  Administered 2017-08-09: 2 g via INTRAVENOUS
  Filled 2017-08-09: qty 100

## 2017-08-09 MED ORDER — FENTANYL CITRATE (PF) 250 MCG/5ML IJ SOLN
INTRAMUSCULAR | Status: AC
  Filled 2017-08-09: qty 5

## 2017-08-09 MED ORDER — MIDAZOLAM HCL 2 MG/2ML IJ SOLN
INTRAMUSCULAR | Status: AC
Start: 2017-08-09 — End: ?
  Filled 2017-08-09: qty 2

## 2017-08-09 MED ORDER — 0.9 % SODIUM CHLORIDE (POUR BTL) OPTIME
TOPICAL | Status: DC | PRN
  Administered 2017-08-09: 1000 mL

## 2017-08-09 MED ORDER — ONDANSETRON HCL 4 MG/2ML IJ SOLN: 4.0000 mg | INTRAMUSCULAR | Status: DC | PRN

## 2017-08-09 MED ORDER — POTASSIUM CHLORIDE IN NACL 20-0.9 MEQ/L-% IV SOLN
INTRAVENOUS | Status: DC
  Administered 2017-08-09: 23:00:00 via INTRAVENOUS
  Administered 2017-08-10: 75 mL/h via INTRAVENOUS
  Administered 2017-08-11: 01:00:00 via INTRAVENOUS
  Filled 2017-08-09 (×4): qty 1000

## 2017-08-09 MED ORDER — DEXAMETHASONE SODIUM PHOSPHATE 4 MG/ML IJ SOLN
4.0000 mg | Freq: Four times a day (QID) | INTRAMUSCULAR | Status: AC
  Administered 2017-08-10 – 2017-08-11 (×4): 4 mg via INTRAVENOUS
  Filled 2017-08-09 (×4): qty 1

## 2017-08-09 MED ORDER — THROMBIN 5000 UNITS EX SOLR
CUTANEOUS | Status: AC
  Filled 2017-08-09: qty 5000

## 2017-08-09 MED ORDER — DEXAMETHASONE SODIUM PHOSPHATE 10 MG/ML IJ SOLN
6.0000 mg | Freq: Four times a day (QID) | INTRAMUSCULAR | Status: AC
  Administered 2017-08-09 – 2017-08-10 (×4): 6 mg via INTRAVENOUS
  Filled 2017-08-09 (×4): qty 1

## 2017-08-09 MED ORDER — ACETAMINOPHEN 650 MG RE SUPP: 650.0000 mg | RECTAL | Status: DC | PRN

## 2017-08-09 MED ORDER — MUPIROCIN 2 % EX OINT
TOPICAL_OINTMENT | CUTANEOUS | Status: AC
  Filled 2017-08-09: qty 22

## 2017-08-09 MED ORDER — ONDANSETRON HCL 4 MG/2ML IJ SOLN
INTRAMUSCULAR | Status: AC
  Filled 2017-08-09: qty 2

## 2017-08-09 MED ORDER — HYDROMORPHONE HCL 1 MG/ML IJ SOLN
INTRAMUSCULAR | Status: AC
  Filled 2017-08-09: qty 1

## 2017-08-09 MED ORDER — OXYMETAZOLINE HCL 0.05 % NA SOLN
NASAL | Status: DC | PRN
  Administered 2017-08-09: 1

## 2017-08-09 MED ORDER — SODIUM CHLORIDE 0.9 % IV SOLN
INTRAVENOUS | Status: DC
  Administered 2017-08-09: 14:00:00 via INTRAVENOUS

## 2017-08-09 MED ORDER — EPINEPHRINE PF 1 MG/ML IJ SOLN
INTRAMUSCULAR | Status: AC
  Filled 2017-08-09: qty 1

## 2017-08-09 MED ORDER — DEXAMETHASONE SODIUM PHOSPHATE 4 MG/ML IJ SOLN
4.0000 mg | Freq: Three times a day (TID) | INTRAMUSCULAR | Status: DC
  Administered 2017-08-11 – 2017-08-12 (×2): 4 mg via INTRAVENOUS
  Filled 2017-08-09 (×2): qty 1

## 2017-08-09 MED ORDER — LIDOCAINE-EPINEPHRINE 1 %-1:100000 IJ SOLN
INTRAMUSCULAR | Status: AC
  Filled 2017-08-09: qty 1

## 2017-08-09 MED ORDER — ONDANSETRON HCL 4 MG PO TABS: 4.0000 mg | ORAL_TABLET | ORAL | Status: DC | PRN

## 2017-08-09 MED ORDER — LACTATED RINGERS IV SOLN: INTRAVENOUS | Status: DC

## 2017-08-09 MED ORDER — HYDROMORPHONE HCL 1 MG/ML IJ SOLN
0.2500 mg | INTRAMUSCULAR | Status: DC | PRN
  Administered 2017-08-09 (×2): 0.25 mg via INTRAVENOUS

## 2017-08-09 MED ORDER — OXYMETAZOLINE HCL 0.05 % NA SOLN
2.0000 | NASAL | Status: DC | PRN
  Administered 2017-08-09: 2 via NASAL
  Filled 2017-08-09: qty 15

## 2017-08-09 MED ORDER — DOCUSATE SODIUM 100 MG PO CAPS
100.0000 mg | ORAL_CAPSULE | Freq: Two times a day (BID) | ORAL | Status: DC
  Administered 2017-08-09 – 2017-08-12 (×4): 100 mg via ORAL
  Filled 2017-08-09 (×5): qty 1

## 2017-08-09 MED ORDER — SUGAMMADEX SODIUM 200 MG/2ML IV SOLN
INTRAVENOUS | Status: DC | PRN
  Administered 2017-08-09: 200 mg via INTRAVENOUS

## 2017-08-09 MED ORDER — METHYLENE BLUE 0.5 % INJ SOLN
INTRAVENOUS | Status: AC
  Filled 2017-08-09: qty 10

## 2017-08-09 MED ORDER — OXYCODONE HCL 5 MG PO TABS: 5.0000 mg | ORAL_TABLET | Freq: Once | ORAL | Status: DC | PRN

## 2017-08-09 MED ORDER — ACETAMINOPHEN 325 MG PO TABS: 650.0000 mg | ORAL_TABLET | ORAL | Status: DC | PRN

## 2017-08-09 MED ORDER — FENTANYL CITRATE (PF) 100 MCG/2ML IJ SOLN
INTRAMUSCULAR | Status: DC | PRN
  Administered 2017-08-09 (×3): 50 ug via INTRAVENOUS
  Administered 2017-08-09: 100 ug via INTRAVENOUS
  Administered 2017-08-09: 50 ug via INTRAVENOUS

## 2017-08-09 MED ORDER — ROCURONIUM BROMIDE 10 MG/ML (PF) SYRINGE
PREFILLED_SYRINGE | INTRAVENOUS | Status: AC
  Filled 2017-08-09: qty 5

## 2017-08-09 MED ORDER — DEXAMETHASONE SODIUM PHOSPHATE 10 MG/ML IJ SOLN
INTRAMUSCULAR | Status: AC
  Filled 2017-08-09: qty 1

## 2017-08-09 MED ORDER — OXYMETAZOLINE HCL 0.05 % NA SOLN
NASAL | Status: AC
  Filled 2017-08-09: qty 15

## 2017-08-09 MED ORDER — PROPOFOL 10 MG/ML IV BOLUS
INTRAVENOUS | Status: DC | PRN
  Administered 2017-08-09: 200 mg via INTRAVENOUS

## 2017-08-09 MED ORDER — ONDANSETRON HCL 4 MG/2ML IJ SOLN
INTRAMUSCULAR | Status: DC | PRN
  Administered 2017-08-09: 4 mg via INTRAVENOUS

## 2017-08-09 MED ORDER — HYDROCODONE-ACETAMINOPHEN 5-325 MG PO TABS
1.0000 | ORAL_TABLET | ORAL | Status: DC | PRN
  Administered 2017-08-09 – 2017-08-12 (×8): 1 via ORAL
  Filled 2017-08-09 (×8): qty 1

## 2017-08-09 MED ORDER — ROCURONIUM BROMIDE 10 MG/ML (PF) SYRINGE
PREFILLED_SYRINGE | INTRAVENOUS | Status: DC | PRN
  Administered 2017-08-09 (×2): 20 mg via INTRAVENOUS
  Administered 2017-08-09: 60 mg via INTRAVENOUS
  Administered 2017-08-09 (×2): 20 mg via INTRAVENOUS

## 2017-08-09 MED ORDER — PANTOPRAZOLE SODIUM 40 MG IV SOLR
40.0000 mg | Freq: Every day | INTRAVENOUS | Status: DC
  Administered 2017-08-09: 40 mg via INTRAVENOUS
  Filled 2017-08-09: qty 40

## 2017-08-09 MED ORDER — SUGAMMADEX SODIUM 200 MG/2ML IV SOLN
INTRAVENOUS | Status: AC
  Filled 2017-08-09: qty 2

## 2017-08-09 MED ORDER — CHLORHEXIDINE GLUCONATE CLOTH 2 % EX PADS
6.0000 | MEDICATED_PAD | Freq: Once | CUTANEOUS | Status: AC
  Administered 2017-08-09: 6 via TOPICAL

## 2017-08-09 MED ORDER — HEMOSTATIC AGENTS (NO CHARGE) OPTIME
TOPICAL | Status: DC | PRN
  Administered 2017-08-09: 1 via TOPICAL

## 2017-08-09 MED ORDER — FLEET ENEMA 7-19 GM/118ML RE ENEM: 1.0000 | ENEMA | Freq: Once | RECTAL | Status: DC | PRN

## 2017-08-09 MED ORDER — LIDOCAINE-EPINEPHRINE 1 %-1:100000 IJ SOLN
INTRAMUSCULAR | Status: DC | PRN
  Administered 2017-08-09: 10 mL

## 2017-08-09 MED ORDER — POVIDONE-IODINE 10 % EX SOLN
CUTANEOUS | Status: DC | PRN
  Administered 2017-08-09: 1 via TOPICAL

## 2017-08-09 MED ORDER — BISACODYL 10 MG RE SUPP: 10.0000 mg | Freq: Every day | RECTAL | Status: DC | PRN

## 2017-08-09 MED ORDER — LABETALOL HCL 5 MG/ML IV SOLN: 10.0000 mg | INTRAVENOUS | Status: DC | PRN

## 2017-08-09 MED ORDER — MORPHINE SULFATE (PF) 4 MG/ML IV SOLN: 1.0000 mg | INTRAVENOUS | Status: DC | PRN

## 2017-08-09 MED ORDER — MIDAZOLAM HCL 5 MG/5ML IJ SOLN
INTRAMUSCULAR | Status: DC | PRN
  Administered 2017-08-09: 2 mg via INTRAVENOUS

## 2017-08-09 MED ORDER — PROMETHAZINE HCL 25 MG PO TABS: 12.5000 mg | ORAL_TABLET | ORAL | Status: DC | PRN

## 2017-08-09 MED ORDER — PHENYLEPHRINE HCL 10 MG/ML IJ SOLN
INTRAVENOUS | Status: DC | PRN
  Administered 2017-08-09: 20 ug/min via INTRAVENOUS

## 2017-08-09 MED ORDER — PROPOFOL 10 MG/ML IV BOLUS
INTRAVENOUS | Status: AC
  Filled 2017-08-09: qty 40

## 2017-08-09 MED ORDER — CEFAZOLIN SODIUM-DEXTROSE 2-4 GM/100ML-% IV SOLN
2.0000 g | Freq: Three times a day (TID) | INTRAVENOUS | Status: AC
  Administered 2017-08-09 – 2017-08-10 (×2): 2 g via INTRAVENOUS
  Filled 2017-08-09 (×2): qty 100

## 2017-08-09 MED ORDER — BACITRACIN ZINC 500 UNIT/GM EX OINT
TOPICAL_OINTMENT | CUTANEOUS | Status: AC
  Filled 2017-08-09: qty 28.35

## 2017-08-09 MED ORDER — LIDOCAINE 2% (20 MG/ML) 5 ML SYRINGE
INTRAMUSCULAR | Status: DC | PRN
  Administered 2017-08-09: 80 mg via INTRAVENOUS

## 2017-08-09 SURGICAL SUPPLY — 121 items
ATTRACTOMAT 16X20 MAGNETIC DRP (DRAPES) IMPLANT
BLADE EYE SICKLE 84 5 BEAV (BLADE) IMPLANT
BLADE ROTATE RAD 40 4 M4 (BLADE) IMPLANT
BLADE ROTATE TRICUT 4X13 M4 (BLADE) IMPLANT
BLADE SURG 10 STRL SS (BLADE) ×2 IMPLANT
BLADE SURG 11 STRL SS (BLADE) ×4 IMPLANT
BLADE SURG 15 STRL LF DISP TIS (BLADE) ×1 IMPLANT
BLADE SURG 15 STRL SS (BLADE) ×1
CABLE BIPOLOR RESECTION CORD (MISCELLANEOUS) ×2 IMPLANT
CANISTER SUCT 3000ML PPV (MISCELLANEOUS) ×2 IMPLANT
CARTRIDGE OIL MAESTRO DRILL (MISCELLANEOUS) IMPLANT
CATH ROBINSON RED A/P 10FR (CATHETERS) IMPLANT
CATH ROBINSON RED A/P 14FR (CATHETERS) IMPLANT
COAGULATOR SUCT 8FR VV (MISCELLANEOUS) IMPLANT
COTTONBALL LRG STERILE PKG (GAUZE/BANDAGES/DRESSINGS) IMPLANT
DECANTER SPIKE VIAL GLASS SM (MISCELLANEOUS) ×2 IMPLANT
DIFFUSER DRILL AIR PNEUMATIC (MISCELLANEOUS) IMPLANT
DRAIN SUBARACHNOID (WOUND CARE) IMPLANT
DRAPE C-ARM 42X72 X-RAY (DRAPES) ×2 IMPLANT
DRAPE EENT ADH APERT 15X15 STR (DRAPES) IMPLANT
DRAPE HALF SHEET 40X57 (DRAPES) ×4 IMPLANT
DRAPE INCISE IOBAN 66X45 STRL (DRAPES) IMPLANT
DRAPE MICROSCOPE LEICA (MISCELLANEOUS) ×2 IMPLANT
DRAPE POUCH INSTRU U-SHP 10X18 (DRAPES) ×2 IMPLANT
DRAPE WARM FLUID 44X44 (DRAPE) IMPLANT
DRESSING NASAL POPE 10X1.5X2.5 (GAUZE/BANDAGES/DRESSINGS) IMPLANT
DRSG NASAL POPE 10X1.5X2.5 (GAUZE/BANDAGES/DRESSINGS)
DRSG NASOPORE 8CM (GAUZE/BANDAGES/DRESSINGS) IMPLANT
DRSG TELFA 3X8 NADH (GAUZE/BANDAGES/DRESSINGS) ×2 IMPLANT
DURAPREP 26ML APPLICATOR (WOUND CARE) IMPLANT
ELECT CAUTERY BLADE 6.4 (BLADE) IMPLANT
ELECT COATED BLADE 2.86 ST (ELECTRODE) ×2 IMPLANT
ELECT NEEDLE TIP 2.8 STRL (NEEDLE) ×2 IMPLANT
ELECT REM PT RETURN 9FT ADLT (ELECTROSURGICAL) ×2
ELECTRODE REM PT RTRN 9FT ADLT (ELECTROSURGICAL) ×1 IMPLANT
FILTER ARTHROSCOPY CONVERTOR (FILTER) IMPLANT
GAUZE PACKING FOLDED 2  STR (GAUZE/BANDAGES/DRESSINGS) ×1
GAUZE PACKING FOLDED 2 STR (GAUZE/BANDAGES/DRESSINGS) ×1 IMPLANT
GAUZE SPONGE 2X2 8PLY STRL LF (GAUZE/BANDAGES/DRESSINGS) IMPLANT
GAUZE SPONGE 4X4 12PLY STRL (GAUZE/BANDAGES/DRESSINGS) IMPLANT
GLOVE BIO SURGEON STRL SZ8 (GLOVE) ×4 IMPLANT
GLOVE BIOGEL M 7.0 STRL (GLOVE) ×4 IMPLANT
GLOVE BIOGEL PI IND STRL 8 (GLOVE) ×1 IMPLANT
GLOVE BIOGEL PI IND STRL 8.5 (GLOVE) ×2 IMPLANT
GLOVE BIOGEL PI INDICATOR 8 (GLOVE) ×1
GLOVE BIOGEL PI INDICATOR 8.5 (GLOVE) ×2
GLOVE ECLIPSE 8.0 STRL XLNG CF (GLOVE) ×2 IMPLANT
GLOVE ECLIPSE 8.5 STRL (GLOVE) ×4 IMPLANT
GLOVE EXAM NITRILE LRG STRL (GLOVE) IMPLANT
GLOVE EXAM NITRILE XL STR (GLOVE) IMPLANT
GLOVE EXAM NITRILE XS STR PU (GLOVE) IMPLANT
GOWN STRL REUS W/ TWL LRG LVL3 (GOWN DISPOSABLE) ×2 IMPLANT
GOWN STRL REUS W/ TWL XL LVL3 (GOWN DISPOSABLE) IMPLANT
GOWN STRL REUS W/TWL 2XL LVL3 (GOWN DISPOSABLE) IMPLANT
GOWN STRL REUS W/TWL LRG LVL3 (GOWN DISPOSABLE) ×2
GOWN STRL REUS W/TWL XL LVL3 (GOWN DISPOSABLE)
HEMOSTAT SURGICEL 2X14 (HEMOSTASIS) IMPLANT
HOOK DURA (MISCELLANEOUS) IMPLANT
KIT BASIN OR (CUSTOM PROCEDURE TRAY) ×2 IMPLANT
KIT ROOM TURNOVER OR (KITS) ×4 IMPLANT
MARKER SKIN DUAL TIP RULER LAB (MISCELLANEOUS) ×2 IMPLANT
NEEDLE 18GX1X1/2 (RX/OR ONLY) (NEEDLE) IMPLANT
NEEDLE HYPO 25GX1X1/2 BEV (NEEDLE) IMPLANT
NEEDLE HYPO 25X1 1.5 SAFETY (NEEDLE) ×2 IMPLANT
NEEDLE SPNL 22GX3.5 QUINCKE BK (NEEDLE) IMPLANT
NEEDLE SPNL 25GX3.5 QUINCKE BL (NEEDLE) IMPLANT
NS IRRIG 1000ML POUR BTL (IV SOLUTION) IMPLANT
OIL CARTRIDGE MAESTRO DRILL (MISCELLANEOUS)
PACK CRANIOTOMY CUSTOM (CUSTOM PROCEDURE TRAY) ×2 IMPLANT
PAD ARMBOARD 7.5X6 YLW CONV (MISCELLANEOUS) ×6 IMPLANT
PATTIES SURGICAL .25X.25 (GAUZE/BANDAGES/DRESSINGS) IMPLANT
PATTIES SURGICAL .5 X.5 (GAUZE/BANDAGES/DRESSINGS) IMPLANT
PATTIES SURGICAL .5 X3 (DISPOSABLE) ×2 IMPLANT
PENCIL BUTTON HOLSTER BLD 10FT (ELECTRODE) IMPLANT
RUBBERBAND STERILE (MISCELLANEOUS) ×4 IMPLANT
SHEATH ENDOSCRUB 0 DEG (SHEATH) IMPLANT
SHEATH ENDOSCRUB 30 DEG (SHEATH) IMPLANT
SHEATH ENDOSCRUB 45 DEG (SHEATH) IMPLANT
SHEET SIL 040 (INSTRUMENTS) ×2 IMPLANT
SPECIMEN JAR SMALL (MISCELLANEOUS) IMPLANT
SPONGE GAUZE 2X2 STER 10/PKG (GAUZE/BANDAGES/DRESSINGS)
SPONGE LAP 4X18 X RAY DECT (DISPOSABLE) IMPLANT
SPONGE NEURO XRAY DETECT 1X3 (DISPOSABLE) IMPLANT
SPONGE SURGIFOAM ABS GEL 12-7 (HEMOSTASIS) IMPLANT
STAPLER SKIN PROX WIDE 3.9 (STAPLE) IMPLANT
STAPLER VISISTAT 35W (STAPLE) ×2 IMPLANT
STRIP CLOSURE SKIN 1/4X4 (GAUZE/BANDAGES/DRESSINGS) IMPLANT
SUT 5.0 PDS RB-1 (SUTURE)
SUT BONE WAX W31G (SUTURE) IMPLANT
SUT CHROMIC 3 0 PS 2 (SUTURE) IMPLANT
SUT CHROMIC 4 0 P 3 18 (SUTURE) ×2 IMPLANT
SUT ETHILON 3 0 FSL (SUTURE) IMPLANT
SUT ETHILON 3 0 PS 1 (SUTURE) ×2 IMPLANT
SUT ETHILON 4 0 PS 2 18 (SUTURE) IMPLANT
SUT ETHILON 6 0 P 1 (SUTURE) ×2 IMPLANT
SUT NOVAFIL 6 0 PRE 2 4412 13 (SUTURE) IMPLANT
SUT PDS AB 4-0 RB1 27 (SUTURE) ×2 IMPLANT
SUT PDS PLUS AB 5-0 RB-1 (SUTURE) IMPLANT
SUT PLAIN 4 0 ~~LOC~~ 1 (SUTURE) IMPLANT
SUT PROLENE 6 0 BV (SUTURE) IMPLANT
SUT VIC AB 2-0 CP2 18 (SUTURE) IMPLANT
SUT VIC AB 2-0 CT1 27 (SUTURE)
SUT VIC AB 2-0 CT1 27XBRD (SUTURE) IMPLANT
SUT VIC AB 3-0 SH 8-18 (SUTURE) IMPLANT
SUT VIC AB 4-0 P-3 18X BRD (SUTURE) IMPLANT
SUT VIC AB 4-0 P3 18 (SUTURE)
SYR 5ML LL (SYRINGE) IMPLANT
SYR CONTROL 10ML LL (SYRINGE) ×2 IMPLANT
TOWEL GREEN STERILE (TOWEL DISPOSABLE) ×2 IMPLANT
TOWEL GREEN STERILE FF (TOWEL DISPOSABLE) ×2 IMPLANT
TOWEL OR 17X24 6PK STRL BLUE (TOWEL DISPOSABLE) ×2 IMPLANT
TRACKER ENT INSTRUMENT (MISCELLANEOUS) IMPLANT
TRACKER ENT PATIENT (MISCELLANEOUS) IMPLANT
TRAP SPECIMEN MUCOUS 40CC (MISCELLANEOUS) IMPLANT
TRAY ENT MC OR (CUSTOM PROCEDURE TRAY) ×2 IMPLANT
TRAY FOLEY W/METER SILVER 16FR (SET/KITS/TRAYS/PACK) ×2 IMPLANT
TUBE CONNECTING 12X1/4 (SUCTIONS) ×2 IMPLANT
TUBING EXTENTION W/L.L. (IV SETS) IMPLANT
TUBING STRAIGHTSHOT EPS 5PK (TUBING) IMPLANT
UNDERPAD 30X30 (UNDERPADS AND DIAPERS) IMPLANT
WATER STERILE IRR 1000ML POUR (IV SOLUTION) ×2 IMPLANT

## 2017-08-09 NOTE — Op Note (Signed)
08/07/2017 - 08/09/2017  5:52 PM  PATIENT:  [name]  27 y.o. male  PRE-OPERATIVE DIAGNOSIS:  Pituitary tumor, Vision loss  POST-OPERATIVE DIAGNOSIS:  pituiary tumor vision loss  PROCEDURE:  Procedure(s) with comments: Transsphenoidal resection of pituitary tumor with Dr. [name] (N/A) - Transsphenoidal resection of pituitary tumor with Dr. 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  It was elected to take him to surgery for trans-sphenoidal resection of pituitary tumor.  Dr. Erik Obey performed exposure and will dictate his portion of the surgery separately.  PROCEDURE:  Patient was brought to the operating room and following the smooth and uncomplicated induction of general endotracheal anesthesia, her head was placed on a horseshoe head holder.  Dr. Erik Obey performed the exposure to the tumor.  The surgery was performed with the aid of the operating microscope.  Upon accessing the floor of the sella, it was  the bone was softened and eroded.  I was able to unroof the sella with 1 and 2 mm Kerrison rongeurs.  I then cauterized the dura with bipolar and opened the dura with an 11 blade in a cruciate fashion.  Hemostasis was assured and the dura was then opened in a cruciate fashion exposing friable, necrotic appearing tumor.  The material was soft and friable and appeared to have a fatty consistency, suggestive of epidermoid material.  Multiple  samples were obtained for pathology.  A variety of angled pituitary curets were then used to remove additional tumor. Extent of resection was confirmed with C-arm.  The bony edges were reconstructed with some of the nasal bone retained from the exposure.  A covering of surgicell was applied to assure hemostasis.   Dr. Erik Obey  then closed.  The patient was extubated and taken to recovery in stable and satisfactory condition, having tolerated the surgery well.  Counts were correct at the end of the case.  PLAN OF CARE: Admit to inpatient   PATIENT DISPOSITION:  PACU - hemodynamically stable.   Delay start of Pharmacological VTE agent (>24hrs) due to surgical blood loss or risk of bleeding: yes

## 2017-08-09 NOTE — Progress Notes (Signed)
Patient is awake and conversant.  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  MAEW.  EOMI.  Doing well.

## 2017-08-09 NOTE — Progress Notes (Addendum)
[facility]                                                                              Patient Demographics  [name], is a 27 y.o. male, DOB - July 05, 1990, SJG:[id number]  Admit date - 08/07/2017   Admitting Physician Toy Baker, MD  Outpatient Primary MD for the patient is Patient, No Pcp Per  Outpatient specialists:   LOS - 2  days   Medical records reviewed and are as summarized below:    Chief Complaint  Patient presents with  . Migraine       Brief summary   Per admit note by Dr. Marvis Repress on 08/07/17:  [name] is a 27 y.o. male with medical history significant of pituitary mass migraine headaches, presented with sudden visual loss. Patient developed sudden onset around 48 AM of visual field loss he reportedly has known history of pituitary tumor but has not followed up with neurology. He states for the past 7 years he have had head ache behind left eye. Was told that he has pituitary tumor but never had MRA. At the time it was only 13mm and he chose not to undergo resection.  Patient was told he has  migraines and had similar episode in June 2018 for which he received migraine cocktail with improvement. He reported associated left-sided headache tat is persistent not worse with activity or bending down not worse with cought. He reported complete loss of vision and temporal sides of his vision bilaterally. 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 Acute renal gland is displaced left or right in the stalk is stressed over the dome of the aneurysm. There is no intrinsic pituitary tumor. Mild mass effect on the prechiasmatic intracranial optic nerves but chiasm is normally located. - Neurology  was consulted, initially it was felt that patient had left ICA aneurysm causing his symptoms, was started on IV Decadron for mass-effect.  However diagnostic angiogram was negative and showed no angiographic evidence of aneurysm in the pituitary region.   -Subsequently it was felt that patient's symptoms were due to pituitary tumor, endocrine workup is in progress.  Prolactin 5.2 hence prolactinoma ruled out -Neurosurgery consulted, plan for transsphenoidal hypophysectomy today     Migraine - Currently stable, continue IV Decadron,  -Currently complaining of headache and no visual improvement   Code Status: Full CODE STATUS DVT Prophylaxis:  SCDs Family Communication: Discussed in detail with the patient, all imaging results, lab results explained to the patient    Disposition Plan:   Time Spent in minutes 25 minutes  Procedures:  MRA brain  Cerebral angiogram 2/14  Consultants:   Neurology Interventional radiology  Antimicrobials:      Medications  Scheduled Meds: . dexamethasone  10 mg Intravenous Q6H  . sodium chloride flush  3 mL Intravenous Q12H   Continuous Infusions: . sodium chloride    .  sodium chloride 75 mL/hr at 08/09/17 1114  .  ceFAZolin (ANCEF) IV     PRN Meds:.sodium chloride, acetaminophen **OR** acetaminophen, HYDROcodone-acetaminophen, LORazepam, ondansetron **OR** ondansetron (ZOFRAN) IV, oxymetazoline, sodium chloride flush   Antibiotics   Anti-infectives (From admission, onward)   Start     Dose/Rate Route Frequency Ordered Stop   08/09/17 0800  ceFAZolin (ANCEF) IVPB 2g/100 mL premix     2 g 200 mL/hr over 30 Minutes Intravenous On call to O.R. 08/09/17 0754 08/10/17 0559        Subjective:   [name] was seen and examined today.  At the time of my encounter, patient tearful, anxious, states no significant improvement in the headache or visual changes.  Anxious about upcoming surgery. Patient denies  chest pain, shortness of  breath, abdominal pain, N/V/D/C, new weakness.     Objective:   Vitals:   08/09/17 0400 08/09/17 0500 08/09/17 0808 08/09/17 1121  BP: 127/80 120/70    Pulse:  (!) 56    Resp: (!) 21 17    Temp:   97.8 F (36.6 C) 98 F (36.7 C)  TempSrc:   Oral Oral  SpO2: 98% 96%    Weight:      Height:        Intake/Output Summary (Last 24 hours) at 08/09/2017 1250 Last data filed at 08/09/2017 1114 Gross per 24 hour  Intake 1275 ml  Output 5200 ml  Net -3925 ml     Wt Readings from Last 3 Encounters:  08/08/17 74.2 kg (163 lb 9.3 oz)     Exam   General: Alert and oriented x 3, NAD, anxious  Eyes:   HEENT:   Cardiovascular: S1 S2 clear, RRR, No pedal edema b/l  Respiratory: Clear to auscultation bilaterally, no wheezing, rales or rhonchi  Gastrointestinal: Soft, nontender, nondistended, + bowel sounds  Ext: no pedal edema bilaterally  Neuro: no new deficits  Musculoskeletal: No digital cyanosis, clubbing  Skin: No rashes  Psych: anxious, alert and oriented x3    Data Reviewed:  I have personally reviewed following labs and imaging studies  Micro Results Recent Results (from the past 240 hour(s))  MRSA PCR Screening     Status: None   Collection Time: 08/08/17 12:00 AM  Result Value Ref Range Status   MRSA by PCR NEGATIVE NEGATIVE Final    Comment:        The GeneXpert MRSA Assay (FDA approved for NASAL specimens only), is one component of a comprehensive MRSA colonization surveillance program. It is not intended to diagnose MRSA infection nor to guide or monitor treatment for MRSA infections. Performed at [facility], [place] [address], [place], [place] [address]   Surgical PCR screen     Status: None   Collection Time: 08/09/17 10:03 AM  Result Value Ref Range Status   MRSA, PCR NEGATIVE NEGATIVE Final   Staphylococcus aureus NEGATIVE NEGATIVE Final    Comment: (NOTE) The Xpert SA Assay (FDA approved for NASAL specimens in patients 25 years of  age and older), is one component of a comprehensive surveillance program. It is not intended to diagnose infection nor to guide or monitor treatment. Performed at [facility], [place] [address] [place], [place] [address]     Radiology Reports Ct Head Wo Contrast  Result Date: 08/07/2017 CLINICAL DATA:  Headache for 3 hours. EXAM: CT HEAD WITHOUT CONTRAST TECHNIQUE: Contiguous axial images were obtained from the base of the skull through the vertex without intravenous  contrast. COMPARISON:  12/02/2016 FINDINGS: Brain: The ventricles are normal in size and configuration. No extra-axial fluid collections are identified. The gray-white differentiation is maintained. No CT findings for acute hemispheric infarction or intracranial hemorrhage. There is a stable pituitary lesion on the left side measuring a maximum of 12 mm. No change when compared to the prior CT scan. The brainstem and cerebellum are normal. Vascular: No hyperdense vessels or obvious aneurysm. Skull: No acute skull fracture.  No bone lesion. Sinuses/Orbits: The paranasal sinuses and mastoid air cells are clear. The globes are intact. Other: No scalp lesions, laceration or hematoma. 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 No new/acute intracranial findings. Electronically Signed   By: Marijo Sanes M.D.   On: 08/07/2017 17:16   Mr Angiogram Head Wo Contrast  Addendum Date: 08/08/2017   ADDENDUM REPORT: 08/08/2017 13:21 ADDENDUM: Catheter angiogram demonstrates no vascular component to the lesion. No evidence for origin from the LEFT ICA. Consideration should be given to nonvascular intrasellar lesion. Given the T1 shortening and peripheral calcification, and ovoid to spherical shape, Rathke's cleft cyst would be a consideration. Eccentric macroadenoma with mucinous secretion or blood products is less favored. Craniopharyngioma is unlikely. Electronically Signed   By: Staci Righter M.D.   On: 08/08/2017 13:21    Result Date: 08/08/2017 CLINICAL DATA:  Headaches with reported bitemporal visual loss. Previous history of severe headaches. EXAM: MRI HEAD WITHOUT AND WITH CONTRAST MRA HEAD WITHOUT CONTRAST TECHNIQUE: Multiplanar, multiecho pulse sequences of the brain and surrounding structures were obtained without and with intravenous contrast. Angiographic images of the head were obtained using MRA technique without contrast. CONTRAST:  47mL MULTIHANCE GADOBENATE DIMEGLUMINE 529 MG/ML IV SOLN COMPARISON:  CT head 08/07/2017.  CT head 12/02/2016. FINDINGS: MRI HEAD FINDINGS Brain: No acute stroke, acute hemorrhage, intra-axial mass lesion, hydrocephalus, or extra-axial fluid. Normal for age cerebral volume. No white matter disease. 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 There is slight heterogeneity of signal within the lesion on T1 and FLAIR imaging, consistent with swirling blood. The lesion is roughly spherical, 12 mm diameter. The lesion does not enhance in conjunction with pituitary. Pituitary stalk is deviated LEFT-to-RIGHT. The lesion is outside the pituitary gland, but does accumulate contrast on post infusion imaging. The lesion does demonstrate mild mass effect on the pre chiasmatic intracranial optic nerves but the chiasm appears normally located, and surrounded by CSF. Vascular: Carotid, basilar, vertebral flow voids are preserved. The intrasellar structure appears to have an arterial signature, with flow void similar to the carotid artery. Skull and upper cervical spine: Normal marrow signal. Sinuses/Orbits: Negative. Other: Compared with prior CT, similar appearance. MRA HEAD FINDINGS The internal carotid arteries are widely patent. The basilar artery is widely patent with vertebrals codominant. There is no intracranial stenosis. There is T1 shortening associated with abnormal flow related enhancement of an intrasellar  12 mm aneurysm arising from the superior cavernous segment of the LEFT internal carotid artery. There appears to have a narrow neck based on examination is series 12, image 60 axial source image. 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 The pituitary gland is displaced LEFT-to-RIGHT, in the stalk is stretched over the dome of the aneurysm. There is no intrinsic pituitary tumor. Mild mass effect on the pre chiasmatic intracranial optic nerves, but the chiasm appears normally located. Neuro-Interventional Radiology consultation  is suggested to evaluate the appropriateness of potential treatment. Non-emergent evaluation can be arranged by calling [phone] during usual hours. Emergency evaluation can be requested by paging [phone]. Electronically Signed: By: Staci Righter M.D. On: 08/07/2017 18:50   Mr Jeri Cos And Wo Contrast  Addendum Date: 08/08/2017   ADDENDUM REPORT: 08/08/2017 13:21 ADDENDUM: Catheter angiogram demonstrates no vascular component to the lesion. No evidence for origin from the LEFT ICA. Consideration should be given to nonvascular intrasellar lesion. Given the T1 shortening and peripheral calcification, and ovoid to spherical shape, Rathke's cleft cyst would be a consideration. Eccentric macroadenoma with mucinous secretion or blood products is less favored. Craniopharyngioma is unlikely. Electronically Signed   By: Staci Righter M.D.   On: 08/08/2017 13:21   Result Date: 08/08/2017 CLINICAL DATA:  Headaches with reported bitemporal visual loss. Previous history of severe headaches. EXAM: MRI HEAD WITHOUT AND WITH CONTRAST MRA HEAD WITHOUT CONTRAST TECHNIQUE: Multiplanar, multiecho pulse sequences of the brain and surrounding structures were obtained without and with intravenous contrast. Angiographic images of the head were obtained using MRA technique without contrast. CONTRAST:  60mL MULTIHANCE  GADOBENATE DIMEGLUMINE 529 MG/ML IV SOLN COMPARISON:  CT head 08/07/2017.  CT head 12/02/2016. FINDINGS: MRI HEAD FINDINGS Brain: No acute stroke, acute hemorrhage, intra-axial mass lesion, hydrocephalus, or extra-axial fluid. Normal for age cerebral volume. No white matter disease. 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 There is slight heterogeneity of signal within the lesion on T1 and FLAIR imaging, consistent with swirling blood. The lesion is roughly spherical, 12 mm diameter. The lesion does not enhance in conjunction with pituitary. Pituitary stalk is deviated LEFT-to-RIGHT. The lesion is outside the pituitary gland, but does accumulate contrast on post infusion imaging. The lesion does demonstrate mild mass effect on the pre chiasmatic intracranial optic nerves but the chiasm appears normally located, and surrounded by CSF. Vascular: Carotid, basilar, vertebral flow voids are preserved. The intrasellar structure appears to have an arterial signature, with flow void similar to the carotid artery. Skull and upper cervical spine: Normal marrow signal. Sinuses/Orbits: Negative. Other: Compared with prior CT, similar appearance. MRA HEAD FINDINGS The internal carotid arteries are widely patent. The basilar artery is widely patent with vertebrals codominant. There is no intracranial stenosis. There is T1 shortening associated with abnormal flow related enhancement of an intrasellar 12 mm aneurysm arising from the superior cavernous segment of the LEFT internal carotid artery. There appears to have a narrow neck based on examination is series 12, image 60 axial source image. 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 The pituitary gland is displaced LEFT-to-RIGHT, in the stalk is stretched over the dome of the aneurysm. There is no intrinsic  pituitary tumor. Mild mass effect on the pre chiasmatic intracranial optic nerves, but the chiasm appears normally located. Neuro-Interventional Radiology consultation is suggested to evaluate the appropriateness of potential treatment. Non-emergent evaluation can be arranged by calling [phone] during usual hours. Emergency evaluation can be requested by paging [phone]. Electronically Signed: By: Staci Righter M.D. On: 08/07/2017 18:50    Lab Data:  CBC: Recent Labs  Lab 08/07/17 2042 08/08/17 0023 08/09/17 0614  WBC 10.0 6.7 13.1*  NEUTROABS 9.3*  --   --   HGB 15.4 15.1 14.0  HCT 45.1 44.4 42.3  MCV 86.4 85.7 87.8  PLT 194 235 222   Basic  Metabolic Panel: Recent Labs  Lab 08/07/17 2042 08/08/17 0023 08/09/17 0614  NA 137 141 140  K 4.0 4.2 4.2  CL 106 109 105  CO2 19* 22 24  GLUCOSE 191* 188* 128*  BUN 8 7 10   CREATININE 0.96 0.93 0.88  CALCIUM 9.4 9.6 9.5  MG  --  2.1  --   PHOS  --  2.7  --    GFR: Estimated Creatinine Clearance: 130.2 mL/min (by C-G formula based on SCr of 0.88 mg/dL). Liver Function Tests: Recent Labs  Lab 08/08/17 0023  AST 20  ALT 16*  ALKPHOS 51  BILITOT 0.7  PROT 7.0  ALBUMIN 4.2   No results for input(s): LIPASE, AMYLASE in the last 168 hours. No results for input(s): AMMONIA in the last 168 hours. Coagulation Profile: Recent Labs  Lab 08/08/17 0023  INR 1.03   Cardiac Enzymes: No results for input(s): CKTOTAL, CKMB, CKMBINDEX, TROPONINI in the last 168 hours. BNP (last 3 results) No results for input(s): PROBNP in the last 8760 hours. HbA1C: Recent Labs    08/08/17 0023  HGBA1C 4.8   CBG: No results for input(s): GLUCAP in the last 168 hours. Lipid Profile: No results for input(s): CHOL, HDL, LDLCALC, TRIG, CHOLHDL, LDLDIRECT in the last 72 hours. Thyroid Function Tests: Recent Labs    08/08/17 0023  TSH 0.170*  FREET4 1.20*   Anemia Panel: No results for input(s): VITAMINB12, FOLATE, FERRITIN, TIBC,  IRON, RETICCTPCT in the last 72 hours. Urine analysis: No results found for: COLORURINE, APPEARANCEUR, LABSPEC, PHURINE, GLUCOSEU, HGBUR, BILIRUBINUR, KETONESUR, PROTEINUR, UROBILINOGEN, NITRITE, LEUKOCYTESUR   Arna Luis M.D. [facility] 08/09/2017, 12:50 PM  Pager: [phone] Between 7am to 7pm - call Pager - [phone]  After 7pm go to [email or web address] - password [email or web address]  Call night coverage person covering after 7pm

## 2017-08-09 NOTE — Consult Note (Signed)
Clearance, [name] 27 y.o., male [id number]     Chief Complaint: pituitary apoplexy  HPI: 27 yo wm with known pituitary tumor x 7 yrs.  Chronic headache.  yest with greatly increased headache and loss of peripheral vision.  CT shows enlarged tumor.  aneurysm ruled out by angio.  Dr. Donald Pore proposes trans-sphenoidal hypophysectomy emergently.  ENT to assist with surgical approach.  Hx broken nose in past.  Breathes OK through nose.    PMH: Past Medical History:  Diagnosis Date  . Headache     Surg EX:[id number] reviewed. No pertinent surgical history.  FHx:   Family History  Problem Relation Age of Onset  . Diabetes Mellitus I Sister   . CAD Other    SocHx:  reports that  has never smoked. he has never used smokeless tobacco. He reports that he drinks about 1.2 oz of alcohol per week. He reports that he does not use drugs.  ALLERGIES:  Allergies  Allergen Reactions  . Codeine Nausea And Vomiting    Medications Prior to Admission  Medication Sig Dispense Refill  . acetaminophen (TYLENOL) 500 MG tablet Take 500-1,000 mg by mouth every 6 (six) hours as needed for mild pain.    . ranitidine (ZANTAC) 150 MG tablet Take 150 mg by mouth daily as needed for heartburn.      Results for orders placed or performed during the hospital encounter of 08/07/17 (from the past 48 hour(s))  CBC with Differential     Status: Abnormal   Collection Time: 08/07/17  8:42 PM  Result Value Ref Range   WBC 10.0 4.0 - 10.5 K/uL   RBC 5.22 4.22 - 5.81 MIL/uL   Hemoglobin 15.4 13.0 - 17.0 g/dL   HCT 45.1 39.0 - 52.0 %   MCV 86.4 78.0 - 100.0 fL   MCH 29.5 26.0 - 34.0 pg   MCHC 34.1 30.0 - 36.0 g/dL   RDW 12.5 11.5 - 15.5 %   Platelets 194 150 - 400 K/uL   Neutrophils Relative % 93 %   Neutro Abs 9.3 (H) 1.7 - 7.7 K/uL   Lymphocytes Relative 6 %   Lymphs Abs 0.6 (L) 0.7 - 4.0 K/uL   Monocytes Relative 1 %   Monocytes Absolute 0.1 0.1 - 1.0 K/uL   Eosinophils Relative 0 %   Eosinophils Absolute  0.0 0.0 - 0.7 K/uL   Basophils Relative 0 %   Basophils Absolute 0.0 0.0 - 0.1 K/uL    Comment: Performed at [facility], [address], [place], [place] [address]  Basic metabolic panel     Status: Abnormal   Collection Time: 08/07/17  8:42 PM  Result Value Ref Range   Sodium 137 135 - 145 mmol/L   Potassium 4.0 3.5 - 5.1 mmol/L   Chloride 106 101 - 111 mmol/L   CO2 19 (L) 22 - 32 mmol/L   Glucose, Bld 191 (H) 65 - 99 mg/dL   BUN 8 6 - 20 mg/dL   Creatinine, Ser 0.96 0.61 - 1.24 mg/dL   Calcium 9.4 8.9 - 10.3 mg/dL   GFR calc non Af Amer >60 >60 mL/min   GFR calc Af Amer >60 >60 mL/min    Comment: (NOTE) The eGFR has been calculated using the CKD EPI equation. This calculation has not been validated in all clinical situations. eGFR's persistently <60 mL/min signify possible Chronic Kidney Disease.    Anion gap 12 5 - 15    Comment: Performed at [facility], [place] [address] [place], [place] [address]  MRSA PCR Screening     Status: None   Collection Time: 08/08/17 12:00 AM  Result Value Ref Range   MRSA by PCR NEGATIVE NEGATIVE    Comment:        The GeneXpert MRSA Assay (FDA approved for NASAL specimens only), is one component of a comprehensive MRSA colonization surveillance program. It is not intended to diagnose MRSA infection nor to guide or monitor treatment for MRSA infections. Performed at [facility], [place] [address] [place], [place] [address]   Protime-INR     Status: None   Collection Time: 08/08/17 12:23 AM  Result Value Ref Range   Prothrombin Time 13.4 11.4 - 15.2 seconds   INR 1.03     Comment: Performed at [facility] [address], [place], [place] [address]  APTT     Status: None   Collection Time: 08/08/17 12:23 AM  Result Value Ref Range   aPTT 29 24 - 36 seconds    Comment: Performed at [facility] [address], [place], [place] [address]  HIV antibody (Routine Testing)     Status: None   Collection  Time: 08/08/17 12:23 AM  Result Value Ref Range   HIV Screen 4th Generation wRfx Non Reactive Non Reactive    Comment: (NOTE) Performed At: [facility] [place], [place] [id number] Rush Farmer MD [id number] Performed at [facility], [place] [address] [place], [place] [address]   Magnesium     Status: None   Collection Time: 08/08/17 12:23 AM  Result Value Ref Range   Magnesium 2.1 1.7 - 2.4 mg/dL    Comment: Performed at [facility], [place] [address] [place], [place] [address]  Phosphorus     Status: None   Collection Time: 08/08/17 12:23 AM  Result Value Ref Range   Phosphorus 2.7 2.5 - 4.6 mg/dL    Comment: Performed at [facility] [address] [place], [place] [address]  TSH     Status: Abnormal   Collection Time: 08/08/17 12:23 AM  Result Value Ref Range   TSH 0.170 (L) 0.350 - 4.500 uIU/mL    Comment: Performed by a 3rd Generation assay with a functional sensitivity of <=0.01 uIU/mL. Performed at [facility], [place] [address], [place], [place] [address]   Comprehensive metabolic panel     Status: Abnormal   Collection Time: 08/08/17 12:23 AM  Result Value Ref Range   Sodium 141 135 - 145 mmol/L   Potassium 4.2 3.5 - 5.1 mmol/L   Chloride 109 101 - 111 mmol/L   CO2 22 22 - 32 mmol/L   Glucose, Bld 188 (H) 65 - 99 mg/dL   BUN 7 6 - 20 mg/dL   Creatinine, Ser 0.93 0.61 - 1.24 mg/dL   Calcium 9.6 8.9 - 10.3 mg/dL   Total Protein 7.0 6.5 - 8.1 g/dL   Albumin 4.2 3.5 - 5.0 g/dL   AST 20 15 - 41 U/L   ALT 16 (L) 17 - 63 U/L   Alkaline Phosphatase 51 38 - 126 U/L   Total Bilirubin 0.7 0.3 - 1.2 mg/dL   GFR calc non Af Amer >60 >60 mL/min   GFR calc Af Amer >60 >60 mL/min    Comment: (NOTE) The eGFR has been calculated using the CKD EPI equation. This calculation has not been validated in all clinical situations.  eGFR's persistently <60 mL/min signify possible Chronic Kidney Disease.    Anion gap 10 5 - 15     Comment: Performed at [facility] [address], [place] [address]  CBC     Status: None   Collection Time: 08/08/17 12:23 AM  Result Value Ref Range   WBC 6.7 4.0 - 10.5 K/uL   RBC 5.18 4.22 - 5.81 MIL/uL   Hemoglobin 15.1 13.0 - 17.0 g/dL   HCT 44.4 39.0 - 52.0 %   MCV 85.7 78.0 - 100.0 fL   MCH 29.2 26.0 - 34.0 pg   MCHC 34.0 30.0 - 36.0 g/dL   RDW 12.2 11.5 - 15.5 %   Platelets 235 150 - 400 K/uL    Comment: Performed at [facility], [place] [address], [place], [place] [address]  Hemoglobin A1c     Status: None   Collection Time: 08/08/17 12:23 AM  Result Value Ref Range   Hgb A1c MFr Bld 4.8 4.8 - 5.6 %    Comment: (NOTE) Pre diabetes:          5.7%-6.4% Diabetes:              >6.4% Glycemic control for   <7.0% adults with diabetes    Mean Plasma Glucose 91.06 mg/dL    Comment: Performed at [facility] [address] [place], [place] [address]  T4, free     Status: Abnormal   Collection Time: 08/08/17 12:23 AM  Result Value Ref Range   Free T4 1.20 (H) 0.61 - 1.12 ng/dL    Comment: (NOTE) Biotin ingestion may interfere with free T4 tests. If the results are inconsistent with the TSH level, previous test results, or the clinical presentation, then consider biotin interference. If needed, order repeat testing after stopping biotin. Performed at [facility], [place] [address] [place], [place] [address]   Prolactin     Status: None   Collection Time: 08/08/17 12:25 PM  Result Value Ref Range   Prolactin 5.2 4.0 - 15.2 ng/mL    Comment: (NOTE) Performed At: [facility] [place], [place] [id number] Rush Farmer MD [id number] Performed at [facility], [place] [address], [place], [place] [address]   CBC     Status: Abnormal   Collection Time: 08/09/17  6:14 AM  Result Value Ref Range   WBC 13.1 (H) 4.0 - 10.5 K/uL   RBC 4.82 4.22 - 5.81 MIL/uL   Hemoglobin 14.0 13.0 - 17.0 g/dL   HCT 42.3 39.0 -  52.0 %   MCV 87.8 78.0 - 100.0 fL   MCH 29.0 26.0 - 34.0 pg   MCHC 33.1 30.0 - 36.0 g/dL   RDW 12.7 11.5 - 15.5 %   Platelets 222 150 - 400 K/uL    Comment: Performed at [facility], [place] [address], [place],  [address]  Basic metabolic panel     Status: Abnormal   Collection Time: 08/09/17  6:14 AM  Result Value Ref Range   Sodium 140 135 - 145 mmol/L   Potassium 4.2 3.5 - 5.1 mmol/L   Chloride 105 101 - 111 mmol/L   CO2 24 22 - 32 mmol/L   Glucose, Bld 128 (H) 65 - 99 mg/dL   BUN 10 6 - 20 mg/dL   Creatinine, Ser 0.88 0.61 - 1.24 mg/dL   Calcium 9.5 8.9 - 10.3 mg/dL   GFR calc non Af Amer >  60 >60 mL/min   GFR calc Af Amer >60 >60 mL/min    Comment: (NOTE) The eGFR has been calculated using the CKD EPI equation. This calculation has not been validated in all clinical situations. eGFR's persistently <60 mL/min signify possible Chronic Kidney Disease.    Anion gap 11 5 - 15    Comment: Performed at [facility] [address] [place], [place] [address]  Type and screen     Status: None   Collection Time: 08/09/17  6:14 AM  Result Value Ref Range   ABO/RH(D) O POS    Antibody Screen NEG    Sample Expiration      08/12/2017 Performed at [facility], [place] [address], [place], [place] [address]   Cortisol-am, blood     Status: Abnormal   Collection Time: 08/09/17  6:14 AM  Result Value Ref Range   Cortisol - AM 0.6 (L) 6.7 - 22.6 ug/dL    Comment: Performed at [facility] [address], [place], [place] [address]   Ct Head Wo Contrast  Result Date: 08/07/2017 CLINICAL DATA:  Headache for 3 hours. EXAM: CT HEAD WITHOUT CONTRAST TECHNIQUE: Contiguous axial images were obtained from the base of the skull through the vertex without intravenous contrast. COMPARISON:  12/02/2016 FINDINGS: Brain: The ventricles are normal in size and configuration. No extra-axial fluid collections are identified. The gray-white differentiation is maintained. No CT  findings for acute hemispheric infarction or intracranial hemorrhage. There is a stable pituitary lesion on the left side measuring a maximum of 12 mm. No change when compared to the prior CT scan. The brainstem and cerebellum are normal. Vascular: No hyperdense vessels or obvious aneurysm. Skull: No acute skull fracture.  No bone lesion. Sinuses/Orbits: The paranasal sinuses and mastoid air cells are clear. The globes are intact. Other: No scalp lesions, laceration or hematoma. 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 No new/acute intracranial findings. Electronically Signed   By: Marijo Sanes M.D.   On: 08/07/2017 17:16   Mr Angiogram Head Wo Contrast  Addendum Date: 08/08/2017   ADDENDUM REPORT: 08/08/2017 13:21 ADDENDUM: Catheter angiogram demonstrates no vascular component to the lesion. No evidence for origin from the LEFT ICA. Consideration should be given to nonvascular intrasellar lesion. Given the T1 shortening and peripheral calcification, and ovoid to spherical shape, Rathke's cleft cyst would be a consideration. Eccentric macroadenoma with mucinous secretion or blood products is less favored. Craniopharyngioma is unlikely. Electronically Signed   By: Staci Righter M.D.   On: 08/08/2017 13:21   Result Date: 08/08/2017 CLINICAL DATA:  Headaches with reported bitemporal visual loss. Previous history of severe headaches. EXAM: MRI HEAD WITHOUT AND WITH CONTRAST MRA HEAD WITHOUT CONTRAST TECHNIQUE: Multiplanar, multiecho pulse sequences of the brain and surrounding structures were obtained without and with intravenous contrast. Angiographic images of the head were obtained using MRA technique without contrast. CONTRAST:  4m MULTIHANCE GADOBENATE DIMEGLUMINE 529 MG/ML IV SOLN COMPARISON:  CT head 08/07/2017.  CT head 12/02/2016. FINDINGS: MRI HEAD FINDINGS Brain: No acute stroke, acute hemorrhage, intra-axial mass lesion, hydrocephalus, or extra-axial fluid. Normal for age cerebral  volume. No white matter disease. 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 There is slight heterogeneity of signal within the lesion on T1 and FLAIR imaging, consistent with swirling blood. The lesion is roughly spherical, 12 mm diameter. The lesion does  not enhance in conjunction with pituitary. Pituitary stalk is deviated LEFT-to-RIGHT. The lesion is outside the pituitary gland, but does accumulate contrast on post infusion imaging. The lesion does demonstrate mild mass effect on the pre chiasmatic intracranial optic nerves but the chiasm appears normally located, and surrounded by CSF. Vascular: Carotid, basilar, vertebral flow voids are preserved. The intrasellar structure appears to have an arterial signature, with flow void similar to the carotid artery. Skull and upper cervical spine: Normal marrow signal. Sinuses/Orbits: Negative. Other: Compared with prior CT, similar appearance. MRA HEAD FINDINGS The internal carotid arteries are widely patent. The basilar artery is widely patent with vertebrals codominant. There is no intracranial stenosis. There is T1 shortening associated with abnormal flow related enhancement of an intrasellar 12 mm aneurysm arising from the superior cavernous segment of the LEFT internal carotid artery. There appears to have a narrow neck based on examination is series 12, image 60 axial source image. 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 The pituitary gland is displaced LEFT-to-RIGHT, in the stalk is stretched over the dome of the aneurysm. There is no intrinsic pituitary tumor. Mild mass effect on the pre chiasmatic intracranial optic nerves, but the chiasm appears normally located. Neuro-Interventional Radiology consultation is suggested to evaluate the appropriateness of potential treatment. Non-emergent  evaluation can be arranged by calling [phone] during usual hours. Emergency evaluation can be requested by paging [phone]. Electronically Signed: By: Staci Righter M.D. On: 08/07/2017 18:50   Mr Jeri Cos And Wo Contrast  Addendum Date: 08/08/2017   ADDENDUM REPORT: 08/08/2017 13:21 ADDENDUM: Catheter angiogram demonstrates no vascular component to the lesion. No evidence for origin from the LEFT ICA. Consideration should be given to nonvascular intrasellar lesion. Given the T1 shortening and peripheral calcification, and ovoid to spherical shape, Rathke's cleft cyst would be a consideration. Eccentric macroadenoma with mucinous secretion or blood products is less favored. Craniopharyngioma is unlikely. Electronically Signed   By: Staci Righter M.D.   On: 08/08/2017 13:21   Result Date: 08/08/2017 CLINICAL DATA:  Headaches with reported bitemporal visual loss. Previous history of severe headaches. EXAM: MRI HEAD WITHOUT AND WITH CONTRAST MRA HEAD WITHOUT CONTRAST TECHNIQUE: Multiplanar, multiecho pulse sequences of the brain and surrounding structures were obtained without and with intravenous contrast. Angiographic images of the head were obtained using MRA technique without contrast. CONTRAST:  77m MULTIHANCE GADOBENATE DIMEGLUMINE 529 MG/ML IV SOLN COMPARISON:  CT head 08/07/2017.  CT head 12/02/2016. FINDINGS: MRI HEAD FINDINGS Brain: No acute stroke, acute hemorrhage, intra-axial mass lesion, hydrocephalus, or extra-axial fluid. Normal for age cerebral volume. No white matter disease. 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 There is slight heterogeneity of signal within the lesion on T1 and FLAIR imaging, consistent with swirling blood. The lesion is roughly spherical, 12 mm diameter. The lesion does not enhance in conjunction with pituitary. Pituitary stalk is deviated LEFT-to-RIGHT. The lesion is outside  the pituitary gland, but does accumulate contrast on post infusion imaging. The lesion does demonstrate mild mass effect on the pre chiasmatic intracranial optic nerves but the chiasm appears normally located, and surrounded by CSF. Vascular: Carotid, basilar, vertebral flow voids are preserved. The intrasellar structure appears to have an arterial signature, with flow void similar to the carotid artery. Skull and upper cervical spine: Normal marrow signal. Sinuses/Orbits: Negative. Other: Compared with prior CT, similar appearance.  MRA HEAD FINDINGS The internal carotid arteries are widely patent. The basilar artery is widely patent with vertebrals codominant. There is no intracranial stenosis. There is T1 shortening associated with abnormal flow related enhancement of an intrasellar 12 mm aneurysm arising from the superior cavernous segment of the LEFT internal carotid artery. There appears to have a narrow neck based on examination is series 12, image 60 axial source image. 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 The pituitary gland is displaced LEFT-to-RIGHT, in the stalk is stretched over the dome of the aneurysm. There is no intrinsic pituitary tumor. Mild mass effect on the pre chiasmatic intracranial optic nerves, but the chiasm appears normally located. Neuro-Interventional Radiology consultation is suggested to evaluate the appropriateness of potential treatment. Non-emergent evaluation can be arranged by calling [phone] during usual hours. Emergency evaluation can be requested by paging [phone]. Electronically Signed: By: Staci Righter M.D. On: 08/07/2017 18:50     Blood pressure 120/70, pulse (!) 56, temperature 97.8 F (36.6 C), temperature source Oral, resp. rate 17, height _0  (1.778 m), weight 74.2 kg (163 lb 9.3 oz), SpO2 96 %.  PHYSICAL EXAM: Overall appearance: healthy, trim Head: NCAT Ears:  not examined Nose: boxy bony dorsum.   Oral Cavity:  Not examined Oral Pharynx/Hypopharynx/Larynx: not examined Neuro:  Grossly intact Neck:  intact  Studies Reviewed:  Head CT    Assessment/Plan Pituitary tumor with headache and acute vision loss.   Plan:  For trans-sphenoidal hypophysectomy later today.  Discussed with pt.  Risks and complications discussed.  Questions answered.  Informed consent obtained.  Pre op Afrin, Decadron, Keflex on order.  [name] 6/81/1572, 9:17 AM

## 2017-08-09 NOTE — Anesthesia Procedure Notes (Signed)
Arterial Line Insertion Start/End2/15/2019 2:15 PM, 08/09/2017 2:30 PM Performed by: Orlie Dakin, CRNA, CRNA  Patient location: Pre-op. Preanesthetic checklist: patient identified, IV checked, risks and benefits discussed, surgical consent, monitors and equipment checked, pre-op evaluation and timeout performed Lidocaine 1% used for infiltration Left, radial was placed Catheter size: 20 G Hand hygiene performed  and maximum sterile barriers used  Allen's test indicative of satisfactory collateral circulation Attempts: 1 Procedure performed without using ultrasound guided technique. Following insertion, dressing applied and Biopatch. Post procedure assessment: normal  Patient tolerated the procedure well with no immediate complications.

## 2017-08-09 NOTE — Anesthesia Procedure Notes (Signed)
Procedure Name: Intubation Date/Time: 08/09/2017 3:22 PM Performed by: Myna Bright, CRNA Pre-anesthesia Checklist: Patient identified, Emergency Drugs available, Suction available and Patient being monitored Patient Re-evaluated:Patient Re-evaluated prior to induction Oxygen Delivery Method: Circle system utilized Preoxygenation: Pre-oxygenation with 100% oxygen Induction Type: IV induction Ventilation: Mask ventilation without difficulty Laryngoscope Size: Mac and 4 Grade View: Grade I Tube type: Oral Tube size: 8.0 mm Number of attempts: 1 Airway Equipment and Method: Stylet Placement Confirmation: ETT inserted through vocal cords under direct vision,  positive ETCO2 and breath sounds checked- equal and bilateral Secured at: 22 cm Tube secured with: Tape Dental Injury: Teeth and Oropharynx as per pre-operative assessment

## 2017-08-09 NOTE — Brief Op Note (Signed)
08/07/2017 - 08/09/2017  5:52 PM  PATIENT:  [name]  27 y.o. male  PRE-OPERATIVE DIAGNOSIS:  Pituitary tumor, Vision loss  POST-OPERATIVE DIAGNOSIS:  pituiary tumor vision loss  PROCEDURE:  Procedure(s) with comments: Transsphenoidal resection of pituitary tumor with Dr. [name] (N/A) - Transsphenoidal resection of pituitary tumor with Dr. 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  It was elected to take him to surgery for trans-sphenoidal resection of pituitary tumor.  Dr. Erik Obey performed exposure and will dictate his portion of the surgery separately.  PROCEDURE:  Patient was brought to the operating room and following the smooth and uncomplicated induction of general endotracheal anesthesia, her head was placed on a horseshoe head holder.  Dr. Erik Obey performed the exposure to the tumor.  The surgery was performed with the aid of the operating microscope.  Upon accessing the floor of the sella, it was  the bone was softened and eroded.  I was able to unroof the sella with 1 and 2 mm Kerrison rongeurs.  I then cauterized the dura with bipolar and opened the dura with an 11 blade in a cruciate fashion.  Hemostasis was assured and the dura was then opened in a cruciate fashion exposing friable, necrotic appearing tumor.  The material was soft and friable and appeared to have a fatty consistency, suggestive of epidermoid material.  Multiple  samples were obtained for pathology.  A variety of angled pituitary curets were then used to remove additional tumor. Extent of resection was confirmed with C-arm.  The bony edges were reconstructed with some of the nasal bone retained from the exposure.  A covering of surgicell was applied to assure hemostasis.   Dr. Erik Obey  then closed.  The patient was extubated and taken to recovery in stable and satisfactory condition, having tolerated the surgery well.  Counts were correct at the end of the case.  PLAN OF CARE: Admit to inpatient   PATIENT DISPOSITION:  PACU - hemodynamically stable.   Delay start of Pharmacological VTE agent (>24hrs) due to surgical blood loss or risk of bleeding: yes

## 2017-08-09 NOTE — Progress Notes (Signed)
Subjective: Patient reports headache worse, vision no change this morning  Objective: Vital signs in last 24 hours: Temp:  [97.6 F (36.4 C)-98.2 F (36.8 C)] 97.6 F (36.4 C) (02/15 0348) Pulse Rate:  [56-97] 56 (02/15 0500) Resp:  [11-27] 17 (02/15 0500) BP: (117-136)/(66-94) 120/70 (02/15 0500) SpO2:  [96 %-100 %] 96 % (02/15 0500)  Intake/Output from previous day: 02/14 0701 - 02/15 0700 In: 1354 [P.O.:1; I.V.:1353] Out: 3700 [Urine:3700] Intake/Output this shift: No intake/output data recorded.  Physical Exam: Visual fields and acuity unchanged (no improvement).  Headache remains severe.  Otherwise, no complaints.  Lab Results: Recent Labs    08/08/17 0023 08/09/17 0614  WBC 6.7 13.1*  HGB 15.1 14.0  HCT 44.4 42.3  PLT 235 222   BMET Recent Labs    08/07/17 2042 08/08/17 0023  NA 137 141  K 4.0 4.2  CL 106 109  CO2 19* 22  GLUCOSE 191* 188*  BUN 8 7  CREATININE 0.96 0.93  CALCIUM 9.4 9.6    Studies/Results: Ct Head Wo Contrast  Result Date: 08/07/2017 CLINICAL DATA:  Headache for 3 hours. EXAM: CT HEAD WITHOUT CONTRAST TECHNIQUE: Contiguous axial images were obtained from the base of the skull through the vertex without intravenous contrast. COMPARISON:  12/02/2016 FINDINGS: Brain: The ventricles are normal in size and configuration. No extra-axial fluid collections are identified. The gray-white differentiation is maintained. No CT findings for acute hemispheric infarction or intracranial hemorrhage. There is a stable pituitary lesion on the left side measuring a maximum of 12 mm. No change when compared to the prior CT scan. The brainstem and cerebellum are normal. Vascular: No hyperdense vessels or obvious aneurysm. Skull: No acute skull fracture.  No bone lesion. Sinuses/Orbits: The paranasal sinuses and mastoid air cells are clear. The globes are intact. Other: No scalp lesions, laceration or hematoma. IMPRESSION: Stable 12 mm pituitary lesion on the left  side. No new/acute intracranial findings. Electronically Signed   By: Marijo Sanes M.D.   On: 08/07/2017 17:16   Mr Angiogram Head Wo Contrast  Addendum Date: 08/08/2017   ADDENDUM REPORT: 08/08/2017 13:21 ADDENDUM: Catheter angiogram demonstrates no vascular component to the lesion. No evidence for origin from the LEFT ICA. Consideration should be given to nonvascular intrasellar lesion. Given the T1 shortening and peripheral calcification, and ovoid to spherical shape, Rathke's cleft cyst would be a consideration. Eccentric macroadenoma with mucinous secretion or blood products is less favored. Craniopharyngioma is unlikely. Electronically Signed   By: Staci Righter M.D.   On: 08/08/2017 13:21   Result Date: 08/08/2017 CLINICAL DATA:  Headaches with reported bitemporal visual loss. Previous history of severe headaches. EXAM: MRI HEAD WITHOUT AND WITH CONTRAST MRA HEAD WITHOUT CONTRAST TECHNIQUE: Multiplanar, multiecho pulse sequences of the brain and surrounding structures were obtained without and with intravenous contrast. Angiographic images of the head were obtained using MRA technique without contrast. CONTRAST:  5mL MULTIHANCE GADOBENATE DIMEGLUMINE 529 MG/ML IV SOLN COMPARISON:  CT head 08/07/2017.  CT head 12/02/2016. FINDINGS: MRI HEAD FINDINGS Brain: No acute stroke, acute hemorrhage, intra-axial mass lesion, hydrocephalus, or extra-axial fluid. Normal for age cerebral volume. No white matter disease. 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 There is slight heterogeneity of signal within the lesion on T1 and FLAIR imaging, consistent with swirling blood. The lesion is roughly spherical, 12 mm diameter. The lesion does not enhance in conjunction with pituitary.  Pituitary stalk is deviated LEFT-to-RIGHT. The lesion is outside the pituitary gland, but does accumulate contrast on post infusion imaging. The lesion  does demonstrate mild mass effect on the pre chiasmatic intracranial optic nerves but the chiasm appears normally located, and surrounded by CSF. Vascular: Carotid, basilar, vertebral flow voids are preserved. The intrasellar structure appears to have an arterial signature, with flow void similar to the carotid artery. Skull and upper cervical spine: Normal marrow signal. Sinuses/Orbits: Negative. Other: Compared with prior CT, similar appearance. MRA HEAD FINDINGS The internal carotid arteries are widely patent. The basilar artery is widely patent with vertebrals codominant. There is no intracranial stenosis. There is T1 shortening associated with abnormal flow related enhancement of an intrasellar 12 mm aneurysm arising from the superior cavernous segment of the LEFT internal carotid artery. There appears to have a narrow neck based on examination is series 12, image 60 axial source image. 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 The pituitary gland is displaced LEFT-to-RIGHT, in the stalk is stretched over the dome of the aneurysm. There is no intrinsic pituitary tumor. Mild mass effect on the pre chiasmatic intracranial optic nerves, but the chiasm appears normally located. Neuro-Interventional Radiology consultation is suggested to evaluate the appropriateness of potential treatment. Non-emergent evaluation can be arranged by calling [phone] during usual hours. Emergency evaluation can be requested by paging [phone]. Electronically Signed: By: Staci Righter M.D. On: 08/07/2017 18:50   Mr Jeri Cos And Wo Contrast  Addendum Date: 08/08/2017   ADDENDUM REPORT: 08/08/2017 13:21 ADDENDUM: Catheter angiogram demonstrates no vascular component to the lesion. No evidence for origin from the LEFT ICA. Consideration should be given to nonvascular intrasellar lesion. Given the T1 shortening and peripheral calcification, and ovoid  to spherical shape, Rathke's cleft cyst would be a consideration. Eccentric macroadenoma with mucinous secretion or blood products is less favored. Craniopharyngioma is unlikely. Electronically Signed   By: Staci Righter M.D.   On: 08/08/2017 13:21   Result Date: 08/08/2017 CLINICAL DATA:  Headaches with reported bitemporal visual loss. Previous history of severe headaches. EXAM: MRI HEAD WITHOUT AND WITH CONTRAST MRA HEAD WITHOUT CONTRAST TECHNIQUE: Multiplanar, multiecho pulse sequences of the brain and surrounding structures were obtained without and with intravenous contrast. Angiographic images of the head were obtained using MRA technique without contrast. CONTRAST:  59mL MULTIHANCE GADOBENATE DIMEGLUMINE 529 MG/ML IV SOLN COMPARISON:  CT head 08/07/2017.  CT head 12/02/2016. FINDINGS: MRI HEAD FINDINGS Brain: No acute stroke, acute hemorrhage, intra-axial mass lesion, hydrocephalus, or extra-axial fluid. Normal for age cerebral volume. No white matter disease. 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 There is slight heterogeneity of signal within the lesion on T1 and FLAIR imaging, consistent with swirling blood. The lesion is roughly spherical, 12 mm diameter. The lesion does not enhance in conjunction with pituitary. Pituitary stalk is deviated LEFT-to-RIGHT. The lesion is outside the pituitary gland, but does accumulate contrast on post infusion imaging. The lesion does demonstrate mild mass effect on the pre chiasmatic intracranial optic nerves but the chiasm appears normally located, and surrounded by CSF. Vascular: Carotid, basilar, vertebral flow voids are preserved. The intrasellar structure appears to have an arterial signature, with flow void similar to the carotid artery. Skull and upper cervical spine: Normal marrow signal. Sinuses/Orbits: Negative. Other: Compared with prior CT, similar appearance. MRA HEAD  FINDINGS The internal  carotid arteries are widely patent. The basilar artery is widely patent with vertebrals codominant. There is no intracranial stenosis. There is T1 shortening associated with abnormal flow related enhancement of an intrasellar 12 mm aneurysm arising from the superior cavernous segment of the LEFT internal carotid artery. There appears to have a narrow neck based on examination is series 12, image 60 axial source image. 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 The pituitary gland is displaced LEFT-to-RIGHT, in the stalk is stretched over the dome of the aneurysm. There is no intrinsic pituitary tumor. Mild mass effect on the pre chiasmatic intracranial optic nerves, but the chiasm appears normally located. Neuro-Interventional Radiology consultation is suggested to evaluate the appropriateness of potential treatment. Non-emergent evaluation can be arranged by calling [phone] during usual hours. Emergency evaluation can be requested by paging [phone]. Electronically Signed: By: Staci Righter M.D. On: 08/07/2017 18:50    Assessment/Plan: In light of a lack of improvement on high dose steroids, we will proceed with transsphenoidal resection of pituitary adenoma today.  Patient states that prior endocrine workup in [place] showed a non-secreting tumor.  Endocrine labs still pending, but given this, along with vision loss, we will proceed with surgical decompression.  Dr. Erik Obey from ENT will perform exposure.  Risks and benefits were discussed with the patient, who wishes to proceed.    LOS: 2 days    Peggyann Shoals, MD 08/09/2017, 7:32 AM

## 2017-08-09 NOTE — Op Note (Signed)
08/09/2017  6:17 PM    [name]  [id number]   Pre-Op Dx: Pituitary apoplexy  Post-op Dx: Same  Proc: General orotracheal  ENT Surg:  Tyson Alias MD  Neurosurgeon:  Erline Levine MD  Anes:  GOT  EBL:  50 ml    Comp:  None   Regular  Findings:  Straight septum.  Aerated sphenoid sinuses.  intersinus septum off midline to the LEFT.  Procedure: With the patient in a comfortable supine position, general orotracheal anesthesia was induced without difficulty.  At an appropriate level, the patient was placed in a slight sitting position.  The head was rotated to the right for access to the nose.  The C-arm was brought into the field, oriented, and tested.  Routine surgical timeout was obtained in standard fashion.  The patient had received preoperative Afrin spray for topical decongestion.  a saline moistened throat pack was placed.  Nasal vibrissae were trimmed.  Afrin-soaked cottonoids were placed against the nasal septum on both sides.  1% Xylocaine with 1-100,000 epinephrine, 10 mL total was infiltrated into the nasal spine, into the membranous columella, into the anterior floor of the nose on both sides, and into the submucoperichondrial plane of the septum.  Several minutes were allowed for this to take effect.  Routine sterile preparation and draping of the midface was accomplished.  The materials were removed from the nose and observed to be intact and correct in number.  the findings were as described above.  A transcolumellar incision was marked.  The floor tunnels were executed on both sides.  The left one was continued to a hemitransfixion incision which was carried down to the caudal edge of the quadrangular cartilage.  The submucoperichondrial plane of the left septum was elevated up to the dorsum of the nose, back to the rostrum of the sphenoid, and down to the floor and communicated with the floor tunnel.  The chondral ethmoid junction was identified and  opened with a caudal elevator of the opposite submucoperiosteal plane was raised.  The superior perpendicular plate was lysed with an open Jansen-Middleton forceps.  The inferior quadrangular cartilage was dissected from the vomer.  The perpendicular plate was rocked free with a closed Jansen-Middleton forceps.  A 2 mm strut at the inferior quadrangular cartilage was sharply resected submucosally allowing the trapdoor to swing into midline.  The maxillary crest and vomer posterior to this were lowered.  The transcolumellar incision was executed and communicated with the floor incision on the right and the hemitransfixion incision on the left.  The Killian speculum was placed and additional bone was dissected from the rostrum of the sphenoid.    With a Hardy speculum was placed against the rostrum of the sphenoid.  The rostrum was lysed with mallet and osteotomes and rocked free.  The the sphenoidotomy was opened on both sides with up and down Kerrison rongeurs.  The intrasinus septum was reduced.  The mucosa of the sphenoid sinus was partially stripped on both sides.  A straight suction was placed into the recess above the sella and C-arm confirmed orientation.  At this point the procedure was turned over to neurosurgery.  Upon completion of the tumor removal, and repair of the sella, surgery was returned to the otolaryngologist.  The Hardy retractor was relaxed and removed.  The nasal cavity was suctioned of a small amount of clots from both sides.  Septum was secured to the nasal spine with a figure-of-eight of 4-0 PDS suture.  The  columella was reapproximated with buried 4-0 chromic suture, and 6-0 Ethilon suture in a cosmetic fashion on the skin.  The internal incisions were closed with interrupted 4-0 chromic suture.  0.040 reinforced Silastic splints were fashioned and placed against the septum on both sides and secured thereto with a 3-0 Ethilon.  Triple thickness Telfa packs were made and  impregnated with bacitracin ointment , and placed between the septum and the turbinates on both sides.  Hemostasis was observed.  Drapes were removed.  The patient was cleaned.  A drip pad was applied.  The pharynx was suctioned clear and the throat pack was removed.  Dispo:   OR to 3M06  Plan: Ice, elevation, analgesia, antibiosis.  We will remove the nasal packing in 3 days and the septal splints in 10 days.  Tyson Alias MD

## 2017-08-09 NOTE — Anesthesia Postprocedure Evaluation (Signed)
Anesthesia Post Note  Patient: Cale Bethard  Procedure(s) Performed: Transsphenoidal resection of pituitary tumor with Dr. [name] (N/A ) TRANSNASAL APPROACH (N/A )     Patient location during evaluation: PACU Anesthesia Type: General Level of consciousness: awake and alert Pain management: pain level controlled Vital Signs Assessment: post-procedure vital signs reviewed and stable Respiratory status: spontaneous breathing, nonlabored ventilation and respiratory function stable Cardiovascular status: blood pressure returned to baseline and stable Postop Assessment: no apparent nausea or vomiting Anesthetic complications: no    Last Vitals:  Vitals:   08/09/17 1845 08/09/17 1900  BP:    Pulse: (!) 111 (!) 113  Resp: 10 11  Temp:    SpO2: 100% 96%    Last Pain:  Vitals:   08/09/17 1314  TempSrc: Oral  PainSc:                  Jerusalem Brownstein,W. EDMOND

## 2017-08-09 NOTE — Interval H&P Note (Signed)
History and Physical Interval Note:  08/09/2017 2:16 PM  [name]  has presented today for surgery, with the diagnosis of Pituitary tumor, Vision loss  The various methods of treatment have been discussed with the patient and family. After consideration of risks, benefits and other options for treatment, the patient has consented to  Procedure(s) with comments: Transsphenoidal resection of pituitary tumor with Dr. [name] (N/A) - Transsphenoidal resection of pituitary tumor with Dr. [name] TRANSNASAL APPROACH (N/A) as a surgical intervention .  The patient's history has been reviewed, patient examined, no change in status, stable for surgery.  I have reviewed the patient's chart and labs.  Questions were answered to the patient's satisfaction.     [name] D

## 2017-08-09 NOTE — Anesthesia Preprocedure Evaluation (Signed)
Anesthesia Evaluation  Patient identified by MRN, date of birth, ID band Patient awake    Reviewed: Allergy & Precautions, NPO status   Airway Mallampati: I  TM Distance: >3 FB Neck ROM: Full    Dental  (+) Teeth Intact   Pulmonary neg pulmonary ROS,    breath sounds clear to auscultation       Cardiovascular negative cardio ROS   Rhythm:Regular Rate:Normal     Neuro/Psych  Headaches, Pituitary tumor    GI/Hepatic negative GI ROS, Neg liver ROS,   Endo/Other  negative endocrine ROS  Renal/GU negative Renal ROS     Musculoskeletal   Abdominal   Peds  Hematology negative hematology ROS (+)   Anesthesia Other Findings   Reproductive/Obstetrics                             Anesthesia Physical Anesthesia Plan  ASA: II  Anesthesia Plan: General   Post-op Pain Management:    Induction: Intravenous  PONV Risk Score and Plan: 3 and Treatment may vary due to age or medical condition, Dexamethasone and Ondansetron  Airway Management Planned: Oral ETT  Additional Equipment: Arterial line  Intra-op Plan:   Post-operative Plan: Extubation in OR  Informed Consent: I have reviewed the patients History and Physical, chart, labs and discussed the procedure including the risks, benefits and alternatives for the proposed anesthesia with the patient or authorized representative who has indicated his/her understanding and acceptance.   Dental advisory given  Plan Discussed with:   Anesthesia Plan Comments:         Anesthesia Quick Evaluation

## 2017-08-09 NOTE — Transfer of Care (Signed)
Immediate Anesthesia Transfer of Care Note  Patient: [name]  Procedure(s) Performed: Transsphenoidal resection of pituitary tumor with Dr. [name] (N/A ) TRANSNASAL APPROACH (N/A )  Patient Location: PACU  Anesthesia Type:General  Level of Consciousness: awake, alert , oriented and patient cooperative  Airway & Oxygen Therapy: Patient Spontanous Breathing and Patient connected to face mask oxygen  Post-op Assessment: Report given to RN, Post -op Vital signs reviewed and stable and Patient moving all extremities  Post vital signs: Reviewed and stable  Last Vitals:  Vitals:   08/09/17 1121 08/09/17 1314  BP:    Pulse:    Resp:    Temp: 36.7 C 36.8 C  SpO2:      Last Pain:  Vitals:   08/09/17 1314  TempSrc: Oral  PainSc:       Patients Stated Pain Goal: 7 (34/74/25 9563)  Complications: No apparent anesthesia complications

## 2017-08-10 ENCOUNTER — Encounter (HOSPITAL_COMMUNITY): Payer: Self-pay | Admitting: Neurosurgery

## 2017-08-10 DIAGNOSIS — D497 Neoplasm of unspecified behavior of endocrine glands and other parts of nervous system: Secondary | ICD-10-CM

## 2017-08-10 LAB — BASIC METABOLIC PANEL
Anion gap: 11 (ref 5–15)
BUN: 9 mg/dL (ref 6–20)
CO2: 23 mmol/L (ref 22–32)
Calcium: 8.9 mg/dL (ref 8.9–10.3)
Chloride: 105 mmol/L (ref 101–111)
Creatinine, Ser: 0.89 mg/dL (ref 0.61–1.24)
GFR calc Af Amer: >60 mL/min (ref >60)
GFR calc non Af Amer: >60 mL/min (ref >60)
Glucose, Bld: 125 mg/dL — ABNORMAL HIGH (ref 65–99)
Potassium: 4 mmol/L (ref 3.5–5.1)
Sodium: 139 mmol/L (ref 135–145)

## 2017-08-10 LAB — CBC
HCT: 38.8 % — ABNORMAL LOW (ref 39.0–52.0)
Hemoglobin: 12.6 g/dL — ABNORMAL LOW (ref 13.0–17.0)
MCH: 28.4 pg (ref 26.0–34.0)
MCHC: 32.5 g/dL (ref 30.0–36.0)
MCV: 87.6 fL (ref 78.0–100.0)
Platelets: 220 10*3/uL (ref 150–400)
RBC: 4.43 MIL/uL (ref 4.22–5.81)
RDW: 12.7 % (ref 11.5–15.5)
WBC: 17 10*3/uL — ABNORMAL HIGH (ref 4.0–10.5)

## 2017-08-10 LAB — PROLACTIN: Prolactin: 7.6 ng/mL (ref 4.0–15.2)

## 2017-08-10 LAB — LUTEINIZING HORMONE: LH: 9.5 m[IU]/mL — ABNORMAL HIGH (ref 1.7–8.6)

## 2017-08-10 LAB — FOLLICLE STIMULATING HORMONE: FSH: 4.4 m[IU]/mL (ref 1.5–12.4)

## 2017-08-10 LAB — INSULIN-LIKE GROWTH FACTOR: Somatomedin C: 171 ng/mL (ref 98–282)

## 2017-08-10 MED ORDER — DESMOPRESSIN ACETATE 4 MCG/ML IJ SOLN
2.0000 ug | Freq: Once | INTRAMUSCULAR | Status: AC
  Administered 2017-08-10: 2 ug via SUBCUTANEOUS
  Filled 2017-08-10: qty 1

## 2017-08-10 MED ORDER — PANTOPRAZOLE SODIUM 40 MG PO TBEC
40.0000 mg | DELAYED_RELEASE_TABLET | Freq: Every day | ORAL | Status: DC
  Administered 2017-08-10 – 2017-08-11 (×2): 40 mg via ORAL
  Filled 2017-08-10 (×2): qty 1

## 2017-08-10 NOTE — Plan of Care (Signed)
Patient remained stable throughout the night with minor complaints of surgical pain. RN will continue to monitor.

## 2017-08-10 NOTE — Progress Notes (Signed)
MD called and made aware of patient urine output increasing this morning. Urine output more than one liter since 0600. See MAR for detailed output. Urine specific gravity checked was 1.004. RN will continue to monitor.

## 2017-08-10 NOTE — Progress Notes (Signed)
Subjective: Patient reports vision better  Objective: Vital signs in last 24 hours: Temp:  [97.8 F (36.6 C)-98.2 F (36.8 C)] 97.8 F (36.6 C) (02/16 0411) Pulse Rate:  [54-125] 54 (02/16 0600) Resp:  [7-18] 11 (02/16 0600) BP: (116-147)/(64-88) 128/75 (02/16 0600) SpO2:  [95 %-100 %] 98 % (02/16 0600) Arterial Line BP: (137-182)/(57-98) 168/76 (02/16 0600) Weight:  [76.5 kg (168 lb 10.4 oz)] 76.5 kg (168 lb 10.4 oz) (02/15 2030)  Intake/Output from previous day: 02/15 0701 - 02/16 0700 In: 3086.3 [P.O.:420; I.V.:2666.3] Out: 4600 [Urine:4525; Blood:75] Intake/Output this shift: Total I/O In: 1286.3 [P.O.:420; I.V.:866.3] Out: 7893 [Urine:1375]  Physical Exam: PERRL, EOMI.  VFF (fully recovered).  MAEW without drift.  Dressing CDI.  Lab Results: Recent Labs    08/09/17 0614 08/10/17 0526  WBC 13.1* 17.0*  HGB 14.0 12.6*  HCT 42.3 38.8*  PLT 222 220   BMET Recent Labs    08/09/17 0614 08/10/17 0526  NA 140 139  K 4.2 4.0  CL 105 105  CO2 24 23  GLUCOSE 128* 125*  BUN 10 9  CREATININE 0.88 0.89  CALCIUM 9.5 8.9    Studies/Results: Dg Sella Turcica  Result Date: 08/09/2017 CLINICAL DATA:  Intraoperative fluoroscopy during trans-sphenoidal pituitary tumor resection EXAM: SELLA TURCICA; DG C-ARM 61-120 MIN COMPARISON:  None. FINDINGS: Two fluoroscopic images are provided, obtained during trans-sphenoidal pituitary surgery. The first image shows and instrument tip in the sphenoid sinus and the second image shows the tip of a probe along the sellar floor. Reported fluoroscopy time 7 seconds. IMPRESSION: Intraoperative fluoroscopic images. Electronically Signed   By: Ulyses Jarred M.D.   On: 08/09/2017 18:02   Dg C-arm 1-60 Min  Result Date: 08/09/2017 CLINICAL DATA:  Intraoperative fluoroscopy during trans-sphenoidal pituitary tumor resection EXAM: SELLA TURCICA; DG C-ARM 61-120 MIN COMPARISON:  None. FINDINGS: Two fluoroscopic images are provided, obtained  during trans-sphenoidal pituitary surgery. The first image shows and instrument tip in the sphenoid sinus and the second image shows the tip of a probe along the sellar floor. Reported fluoroscopy time 7 seconds. IMPRESSION: Intraoperative fluoroscopic images. Electronically Signed   By: Ulyses Jarred M.D.   On: 08/09/2017 18:02    Assessment/Plan: Patient is doing well.  Vision and headache remarkably improved.D/C Foley and A line.  Mobilize.  Path pending.  No increased urine output.  Will monitor.  Doing well.      LOS: 3 days    Frank Shoals, MD 08/10/2017, 6:50 AM

## 2017-08-10 NOTE — Plan of Care (Addendum)
Patient is currently in ICU s/p transsphenoidal resection of pituitary tumor. Attending MD: Dr Vertell Limber. No other medical issues. I will sign off. Please call me if any questions.  D/w Dr Vertell Limber.    Estill Cotta M.D. [facility] 08/10/2017, 10:08 AM  Pager: [phone]

## 2017-08-11 LAB — CBC
HCT: 38.7 % — ABNORMAL LOW (ref 39.0–52.0)
Hemoglobin: 12.8 g/dL — ABNORMAL LOW (ref 13.0–17.0)
MCH: 28.7 pg (ref 26.0–34.0)
MCHC: 33.1 g/dL (ref 30.0–36.0)
MCV: 86.8 fL (ref 78.0–100.0)
Platelets: 182 10*3/uL (ref 150–400)
RBC: 4.46 MIL/uL (ref 4.22–5.81)
RDW: 12.3 % (ref 11.5–15.5)
WBC: 11 10*3/uL — ABNORMAL HIGH (ref 4.0–10.5)

## 2017-08-11 LAB — BASIC METABOLIC PANEL
Anion gap: 9 (ref 5–15)
BUN: 13 mg/dL (ref 6–20)
CO2: 21 mmol/L — ABNORMAL LOW (ref 22–32)
Calcium: 8.9 mg/dL (ref 8.9–10.3)
Chloride: 105 mmol/L (ref 101–111)
Creatinine, Ser: 0.77 mg/dL (ref 0.61–1.24)
GFR calc Af Amer: >60 mL/min (ref >60)
GFR calc non Af Amer: >60 mL/min (ref >60)
Glucose, Bld: 126 mg/dL — ABNORMAL HIGH (ref 65–99)
Potassium: 4.5 mmol/L (ref 3.5–5.1)
Sodium: 135 mmol/L (ref 135–145)

## 2017-08-11 MED ORDER — HYDROCODONE-ACETAMINOPHEN 5-325 MG PO TABS
2.0000 | ORAL_TABLET | Freq: Once | ORAL | Status: AC
  Administered 2017-08-12: 2 via ORAL
  Filled 2017-08-11: qty 2

## 2017-08-11 NOTE — Progress Notes (Signed)
Patient ID: [name], male   DOB: 09-24-1990, 27 y.o.   MRN: [id number] Vital signs are stable Patient's urine output has come down to near normal levels after 1 dose of DDAVP Patient is mobilizing well but hampered by amount of lines Transferred to the floor Monitor status for further episodes of DI

## 2017-08-12 ENCOUNTER — Encounter (HOSPITAL_COMMUNITY): Payer: Self-pay | Admitting: Neurosurgery

## 2017-08-12 MED ORDER — CEPHALEXIN 500 MG PO CAPS: 500.0000 mg | ORAL_CAPSULE | Freq: Four times a day (QID) | ORAL | 0 refills | Status: DC

## 2017-08-12 MED ORDER — DEXAMETHASONE 0.5 MG PO TABS: 2.0000 mg | ORAL_TABLET | Freq: Two times a day (BID) | ORAL | 0 refills | Status: AC

## 2017-08-12 MED ORDER — CEPHALEXIN 500 MG PO CAPS
500.0000 mg | ORAL_CAPSULE | Freq: Four times a day (QID) | ORAL | Status: DC
  Filled 2017-08-12: qty 1

## 2017-08-12 NOTE — Progress Notes (Signed)
IV's removed, D/C paperwork given to pt and family and signed. All belongings are with patient before being taken outside. Maylene Crocker, Rande Brunt, RN

## 2017-08-12 NOTE — Progress Notes (Signed)
Pt continuing to improve in all aspects.  Very minimal complaints of pain and no neurological issues noted at this time.  Will continue to monitor.

## 2017-08-12 NOTE — Discharge Summary (Signed)
Physician Discharge Summary  Patient ID: [name] MRN: [id number] DOB/AGE: [birth year] 28 y.o.  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  Initial w/u with MRI/MRA suggestive of aneurysm.  Angio negative.  Later felt to be pituitary tumor.  There was not significant chiasmatic compression on MRI, but high dose steroids did not improve situation, so patient was taken to OR for transsphenoidal resection of pituitary tumor.  Patient did well and immediately after surgery, he had recovery of vision and improved headache.  He received one dose of DDAVP postop, but since then has had no problems with excessive thirst, increased urination or hypernatremia.  He was doing well on POD 3 and was discharged home.  Consults: ENT  Significant Diagnostic Studies: angiography: cerebral angiography negative  Treatments: surgery:transsphenoidal resection of pituitary tumor  Discharge Exam: Blood pressure 118/73, pulse (!) 50, temperature 97.8 F (36.6 C), temperature source Oral, resp. rate 13, height 5\' 10"  (1.778 m), weight 76.5 kg (168 lb 10.4 oz), SpO2 98 %. Neurologic: Alert and oriented X 3, normal strength and tone. Normal symmetric reflexes. Normal coordination and gait Wound:CDI  Disposition: Home  Discharge Instructions    Diet - low sodium heart healthy   Complete by:  As directed    Increase activity slowly   Complete by:  As directed      Allergies as of 08/12/2017      Reactions   Codeine Nausea And Vomiting      Medication List    TAKE these medications   acetaminophen  500 MG tablet Commonly known as:  TYLENOL Take 500-1,000 mg by mouth every 6 (six) hours as needed for mild pain.   dexamethasone 0.5 MG tablet Commonly known as:  DECADRON Take 4 tablets (2 mg total) by mouth 2 (two) times daily.   ranitidine 150 MG tablet Commonly known as:  ZANTAC Take 150 mg by mouth daily as needed for heartburn.      Follow-up Information    [name], MD. Schedule an appointment as soon as possible for a visit in 10 days.   Specialty:  Otolaryngology Contact information: [address] [place] [address] [phone]           Signed: Peggyann Shoals, MD 08/12/2017, 8:45 AM

## 2017-08-12 NOTE — Discharge Instructions (Signed)
Keep head elevated 3-4 nights Use nasal saline spray to both sides of nose frequently to keep the old blood and scabs soft. Bacitracin ointment or similar to each nostril morning and evening. Cephalexin antibiotic x 1 week. No strenuous activity x 2 weeks Call for bleeding, signs of infection. Recheck 7 days my office, [phone]

## 2017-08-12 NOTE — Progress Notes (Signed)
Packing removed by ENT. Minimal drainage. External packing replaced. Gera Inboden, Rande Brunt, RN

## 2017-08-12 NOTE — Progress Notes (Signed)
08/12/2017 10:10 AM  [name] [id number]  Post-Op Day 3    Temp:  [97.6 F (36.4 C)-98 F (36.7 C)] 97.9 F (36.6 C) (02/18 0800) Pulse Rate:  [42-99] 68 (02/18 1000) Resp:  [11-18] 17 (02/18 1000) BP: (113-135)/(72-97) 134/84 (02/18 1000) SpO2:  [96 %-100 %] 100 % (02/18 1000),     Intake/Output Summary (Last 24 hours) at 08/12/2017 1010 Last data filed at 08/12/2017 0900 Gross per 24 hour  Intake 2343 ml  Output -  Net 2343 ml    No results found for this or any previous visit (from the past 24 hour(s)).  SUBJECTIVE:  Min pain.  Min bleeding  OBJECTIVE:  Nasal packs out.  No bleeding.  IMPRESSION:   Satisfactory check  PLAN:  Discharge home.  Recheck my office 1 week for septal splint removal.  Cephalexin Rx given. Instructions written and given.   [name]

## 2017-08-12 NOTE — Progress Notes (Addendum)
Subjective: Patient reports "Doing well"  Objective: Vital signs in last 24 hours: Temp:  [97.6 F (36.4 C)-98 F (36.7 C)] 97.8 F (36.6 C) (02/18 0400) Pulse Rate:  [42-93] 50 (02/18 0700) Resp:  [11-18] 13 (02/18 0700) BP: (113-135)/(72-97) 118/73 (02/18 0700) SpO2:  [96 %-100 %] 98 % (02/18 0700)  Intake/Output from previous day: 02/17 0701 - 02/18 0700 In: 2565 [P.O.:840; I.V.:1725] Out: -  Intake/Output this shift: No intake/output data recorded.  Alert, conversant, family present. MAEW. PEARL. No post-nasal drip. Nasal drsg dry. Pt reports no headache or other complaint.   Lab Results: Recent Labs    08/10/17 0526 08/11/17 0434  WBC 17.0* 11.0*  HGB 12.6* 12.8*  HCT 38.8* 38.7*  PLT 220 182   BMET Recent Labs    08/10/17 0526 08/11/17 0222  NA 139 135  K 4.0 4.5  CL 105 105  CO2 23 21*  GLUCOSE 125* 126*  BUN 9 13  CREATININE 0.89 0.77  CALCIUM 8.9 8.9    Studies/Results: No results found.  Assessment/Plan: Improving  LOS: 5 days  Continue support   Verdis Prime 08/12/2017, 7:57 AM  Visual fields full.  EOMI.  No diplopia.  Corrected visual acuity (with glasses) is at baseline.  Doing well.  Discharge home.

## 2017-08-23 DIAGNOSIS — E237 Disorder of pituitary gland, unspecified: Secondary | ICD-10-CM

## 2017-08-28 ENCOUNTER — Encounter (HOSPITAL_COMMUNITY): Payer: Self-pay

## 2017-12-27 ENCOUNTER — Other Ambulatory Visit (HOSPITAL_COMMUNITY): Payer: Self-pay | Admitting: Neurosurgery

## 2017-12-27 DIAGNOSIS — D443 Neoplasm of uncertain behavior of pituitary gland: Secondary | ICD-10-CM

## 2019-04-07 IMAGING — CT CT HEAD W/O CM
4 series · 16 of 47 positions shown, 18 images · non-contrast
Comparison: 12/02/2016

CLINICAL DATA: Headache for 3 hours.

EXAM:
CT HEAD WITHOUT CONTRAST
TECHNIQUE: Contiguous axial images were obtained from the base of the skull
through the vertex without intravenous contrast.

[Series 3: head wo · axial · 0.46mm/px · z∈[-97,+28]mm · 7 of 35 slices shown, 9 images]
[im 5/35  brain]
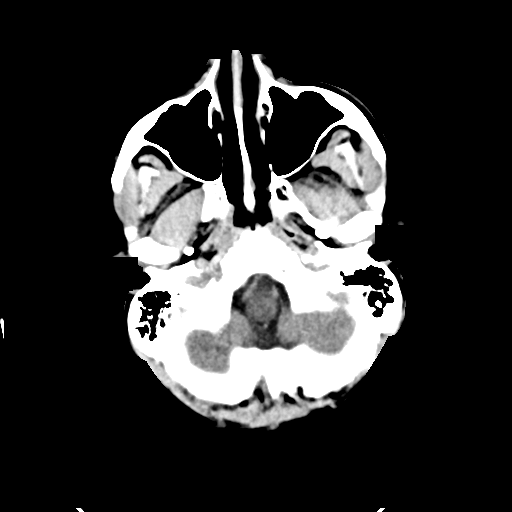
[im 5/35  bone]
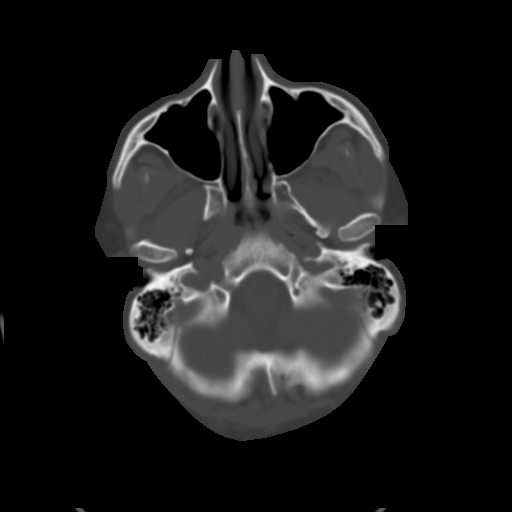
[im 9/35  brain]
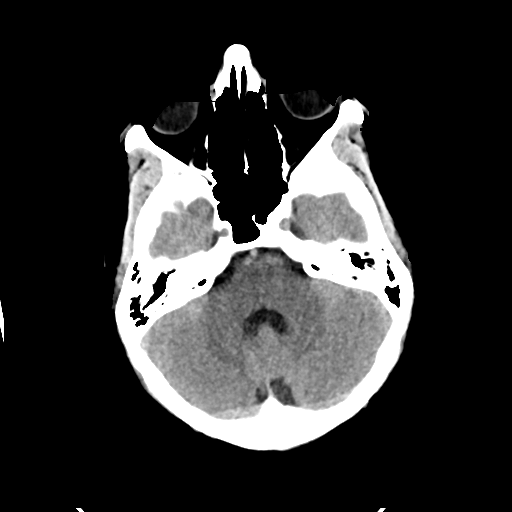
[im 13/35  brain]
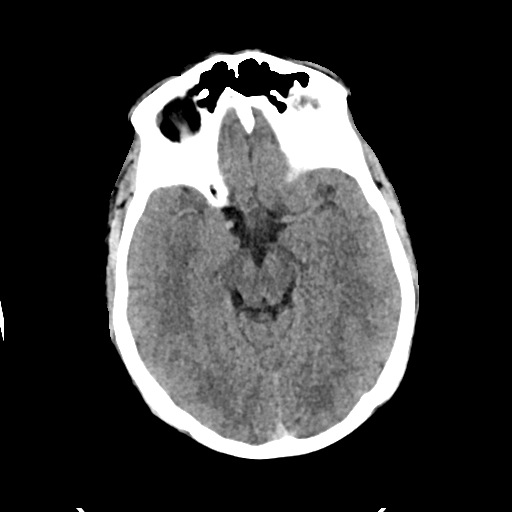
[im 18/35  brain]
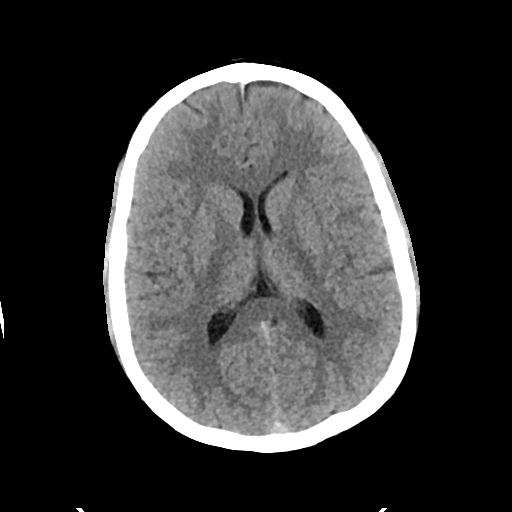
[im 22/35  brain]
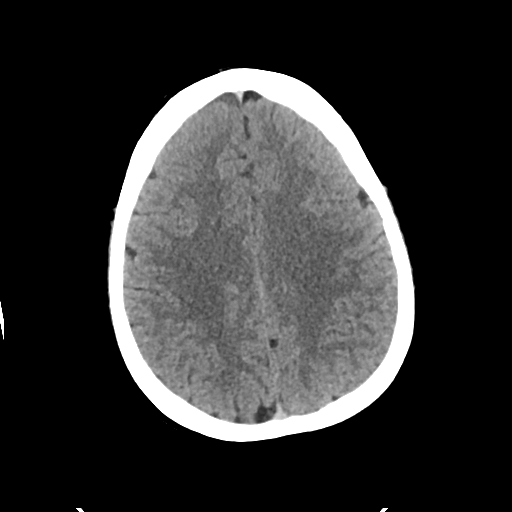
[im 22/35  bone]
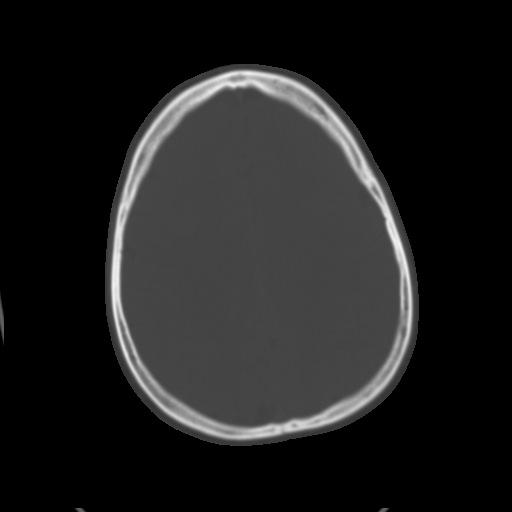
[im 26/35  brain]
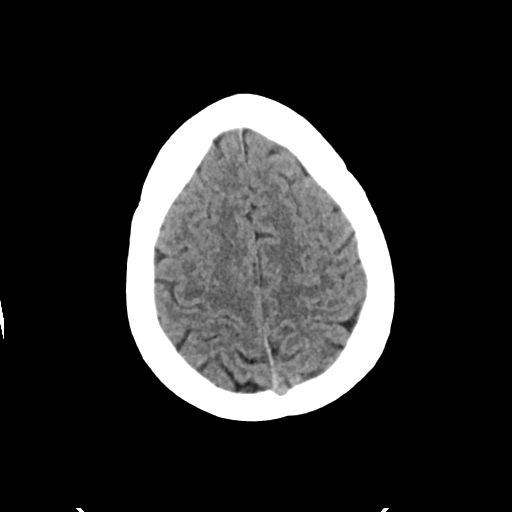
[im 30/35  brain]
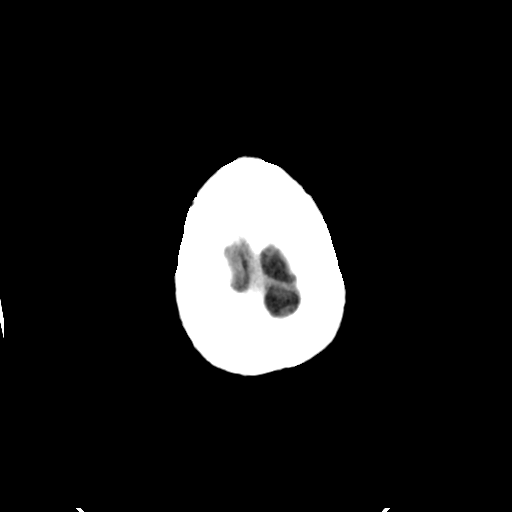

[Series 4: head bone · axial · 0.46mm/px · z∈[-101,-67]mm · 3 of 86 slices shown]
[im 9/86  bone]
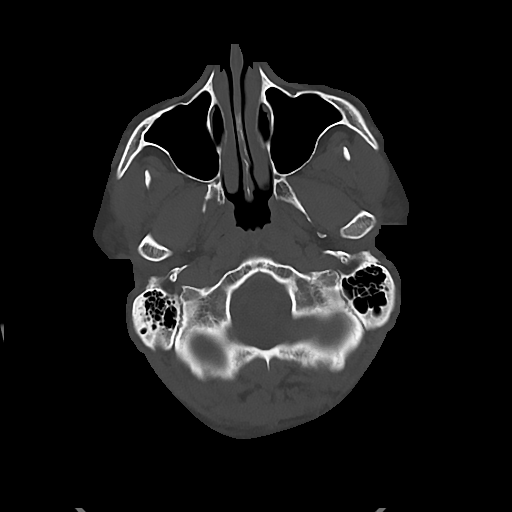
[im 18/86  bone]
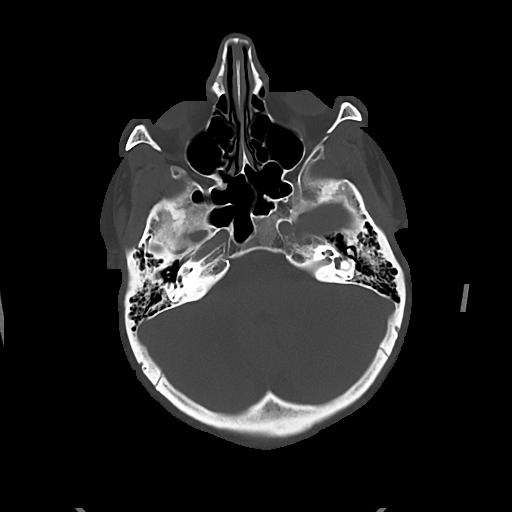
[im 26/86  bone]
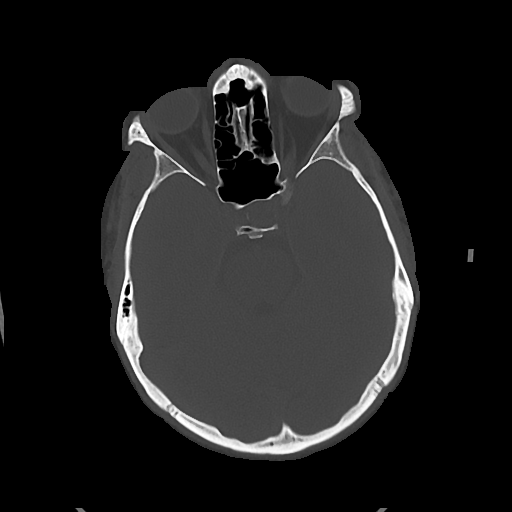

[Series 5: cor soft · coronal · 0.33mm/px · 3 of 71 slices shown]
[im 24/71  brain]
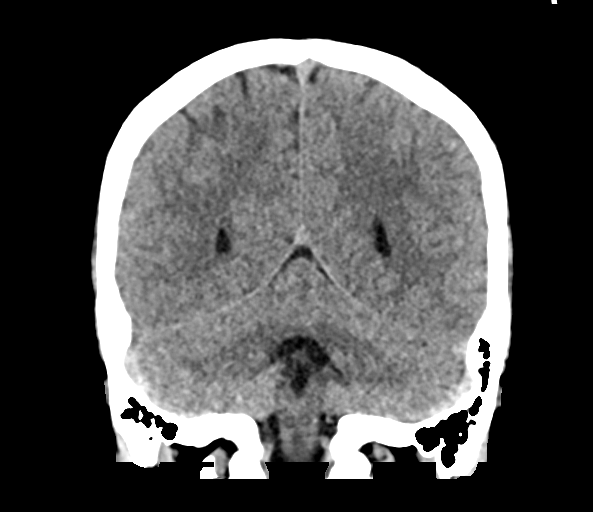
[im 32/71  brain]
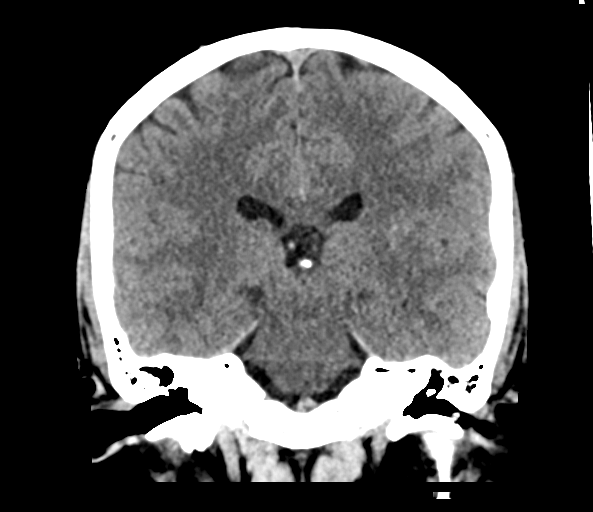
[im 39/71  brain]
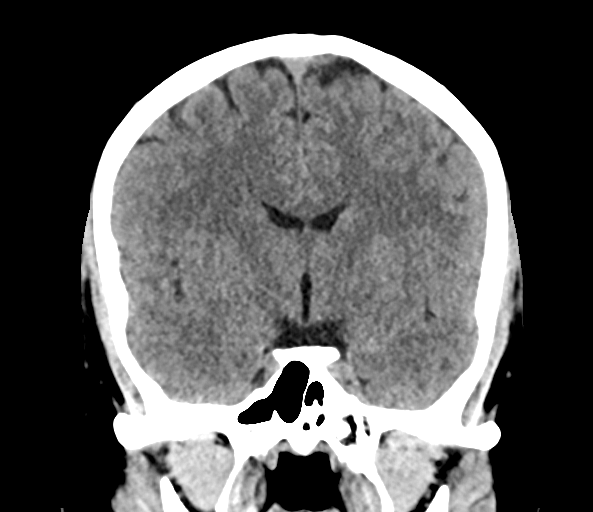

[Series 6: sag soft · sagittal · 0.34mm/px · 3 of 61 slices shown]
[im 21/61  brain]
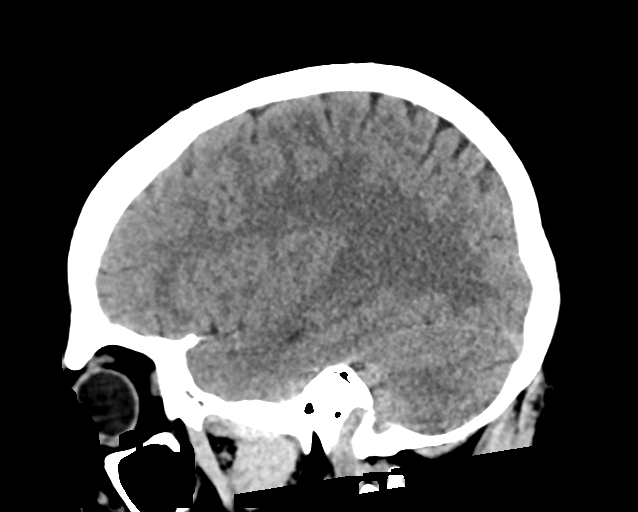
[im 31/61  brain]
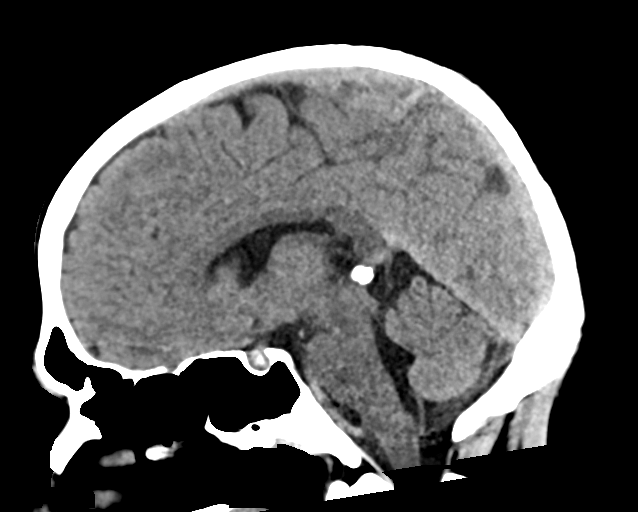
[im 41/61  brain]
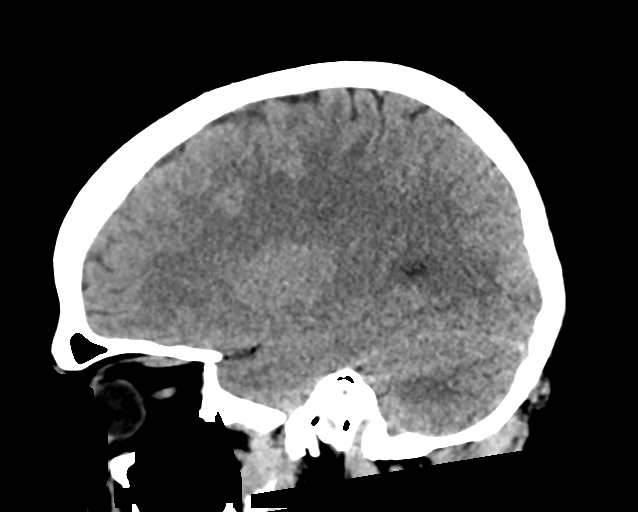

[16 of 47 positions shown; findings below may reference images not displayed]

FINDINGS: Brain: The ventricles are normal in size and configuration. No
extra-axial fluid collections are identified. The gray-white
differentiation is maintained. No CT findings for acute hemispheric
infarction or intracranial hemorrhage. There is a stable pituitary
lesion on the left side measuring a maximum of 12 mm. No change when
compared to the prior CT scan. The brainstem and cerebellum are
normal.

Vascular: No hyperdense vessels or obvious aneurysm.

Skull: No acute skull fracture.  No bone lesion.

Sinuses/Orbits: The paranasal sinuses and mastoid air cells are
clear. The globes are intact.

Other: No scalp lesions, laceration or hematoma.
IMPRESSION: Stable 12 mm pituitary lesion on the left side.

No new/acute intracranial findings.

## 2019-04-07 IMAGING — MR MR MRA HEAD W/O CM
12 of 20 series · 22 of 48 positions shown · IV contrast (15    MH)
Comparison: CT head 08/07/2017.  CT head 12/02/2016.

ADDENDUM:
Catheter angiogram demonstrates no vascular component to the lesion.
No evidence for origin from the LEFT ICA. Consideration should be
given to nonvascular intrasellar lesion. Given the T1 shortening and
peripheral calcification, and ovoid to spherical shape, Rathke's
cleft cyst would be a consideration. Eccentric macroadenoma with
mucinous secretion or blood products is less favored.
Craniopharyngioma is unlikely.
CLINICAL DATA: Headaches with reported bitemporal visual loss.
Previous history of severe headaches.

EXAM:
MRI HEAD WITHOUT AND WITH CONTRAST
MRA HEAD WITHOUT CONTRAST
TECHNIQUE: Multiplanar, multiecho pulse sequences of the brain and surrounding
structures were obtained without and with intravenous contrast.
Angiographic images of the head were obtained using MRA technique
without contrast.
CONTRAST:  15mL MULTIHANCE GADOBENATE DIMEGLUMINE 529 MG/ML IV SOLN

[Series 3: T1 · sagittal · 5.0mm · 0.47mm/px · 1 of 23 slices shown (1 of 3)]
[im 1/23]
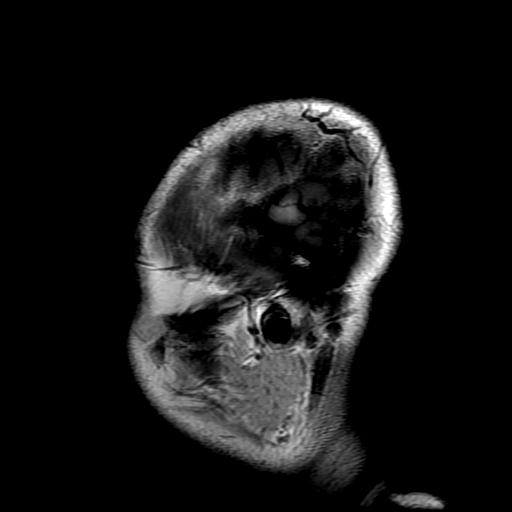

[Series 4: DWI · axial · 3.0mm · 1.09mm/px · z∈[-52,+79]mm · 7 of 90 slices shown]
[im 1/90]
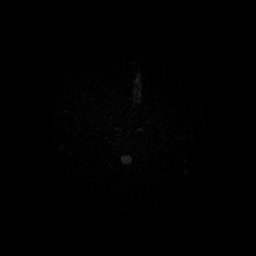
[im 15/90]
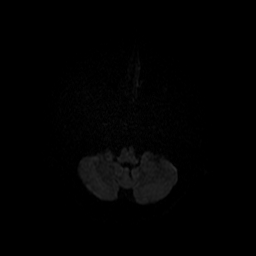
[im 30/90]
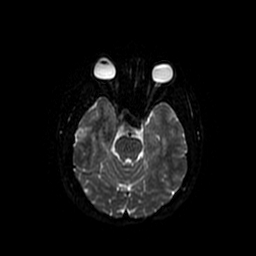
[im 45/90]
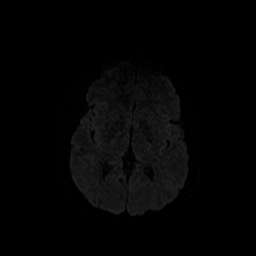
[im 60/90]
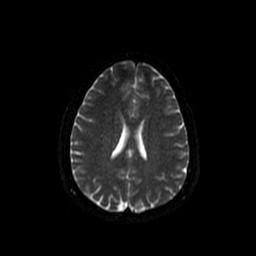
[im 75/90]
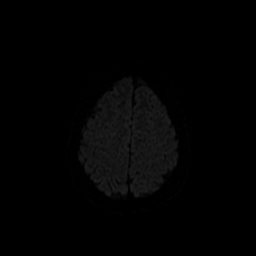
[im 90/90]
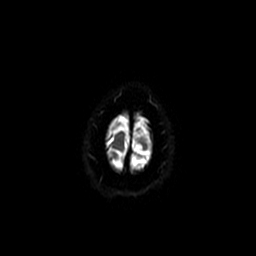

[Series 5: T2 · axial · 5.0mm · 0.43mm/px · 1 of 23 slices shown (1 of 2)]
[im 1/23]
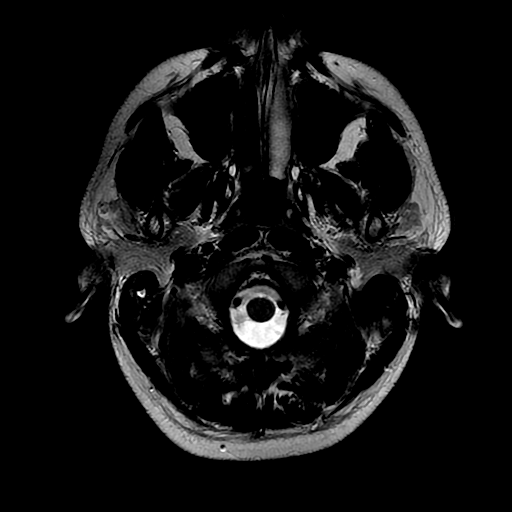

[Series 6: FLAIR · axial · 3.0mm · 0.43mm/px · z∈[-43,+87]mm · 2 of 23 slices shown]
[im 1/23]
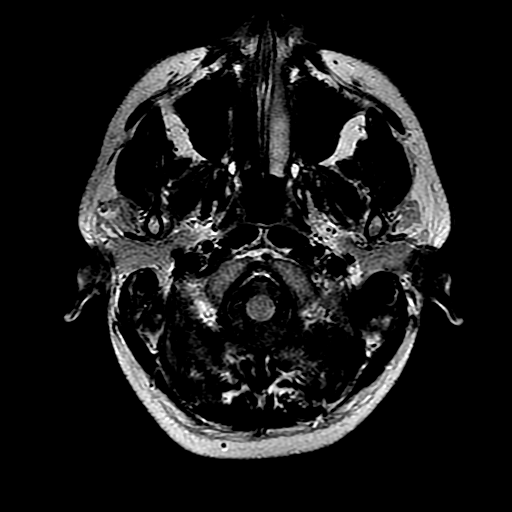
[im 23/23]
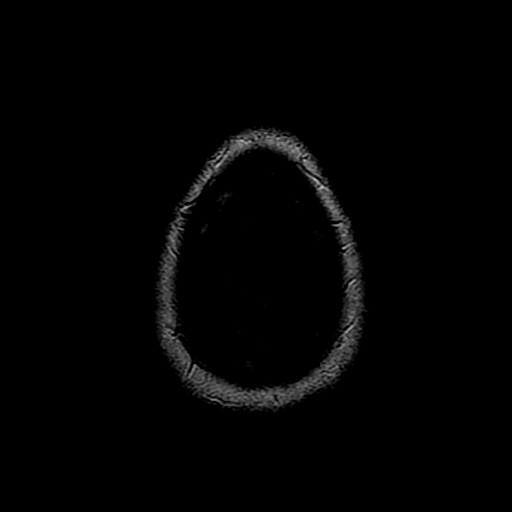

[Series 7: ax mpgr · axial · 5.0mm · 0.43mm/px · z∈[-43,+87]mm · 2 of 23 slices shown]
[im 1/23]
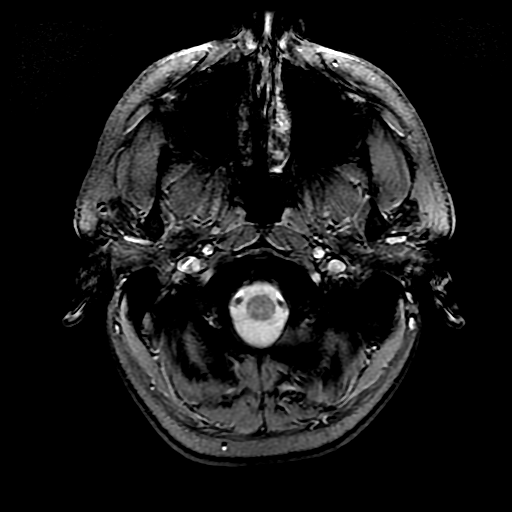
[im 23/23]
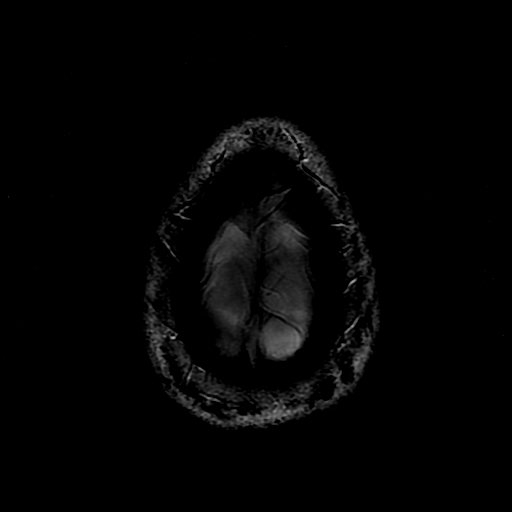

[Series 8: T2 · coronal · 5.0mm · 0.39mm/px · 2 of 25 slices shown (2 of 2)]
[im 1/25]
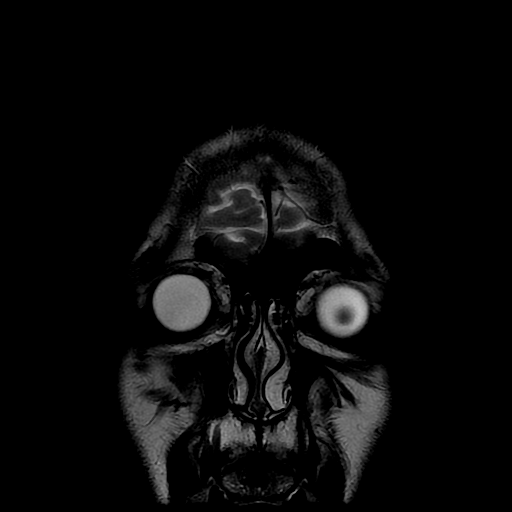
[im 25/25]
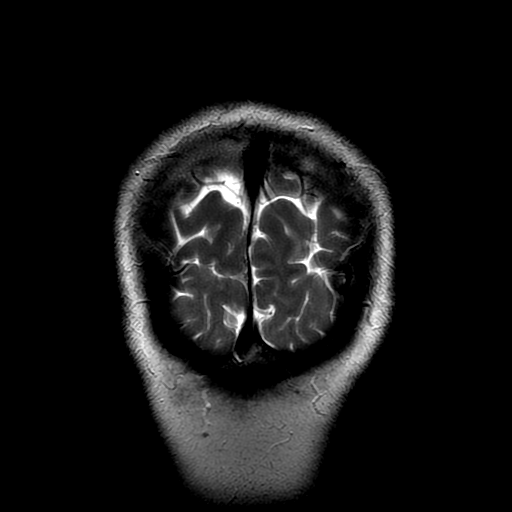

[Series 9: T1 · sagittal · 3.0mm · 0.35mm/px · 1 of 13 slices shown (2 of 3)]
[im 1/13]
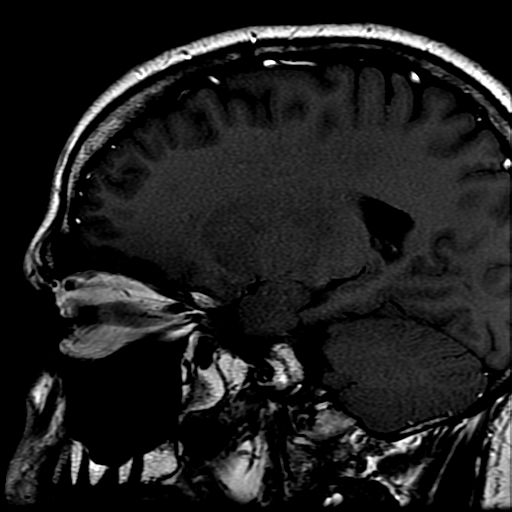

[Series 10: T1 · coronal · 3.0mm · 0.35mm/px · 1 of 13 slices shown (3 of 3)]
[im 1/13]
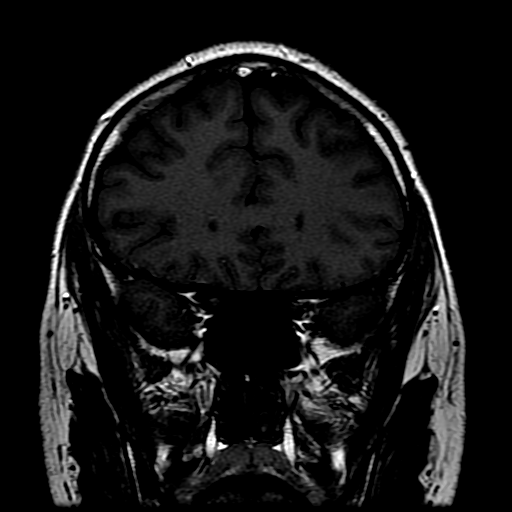

[Series 12: (id) mt fs · axial · 1.4mm · 0.43mm/px · 1 of 152 slices shown]
[im 1/152]
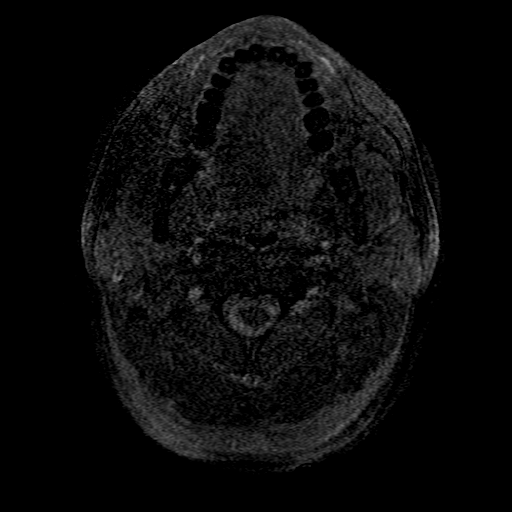

[Series 15: T1 post-contrast · sagittal · 3.0mm · 0.35mm/px · 1 of 13 slices shown (1 of 3)]
[im 1/13]
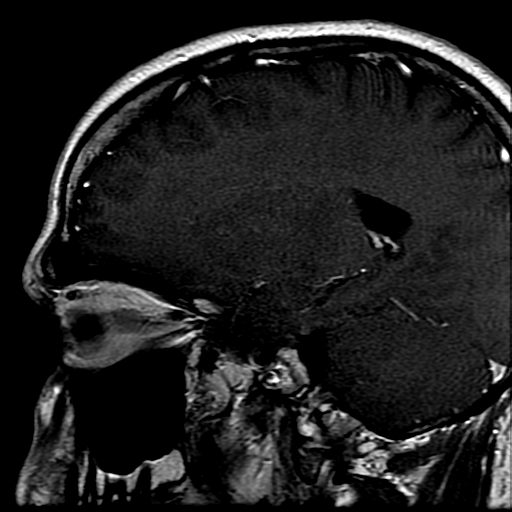

[Series 16: T1 post-contrast · coronal · 3.0mm · 0.35mm/px · 1 of 13 slices shown (2 of 3)]
[im 1/13]
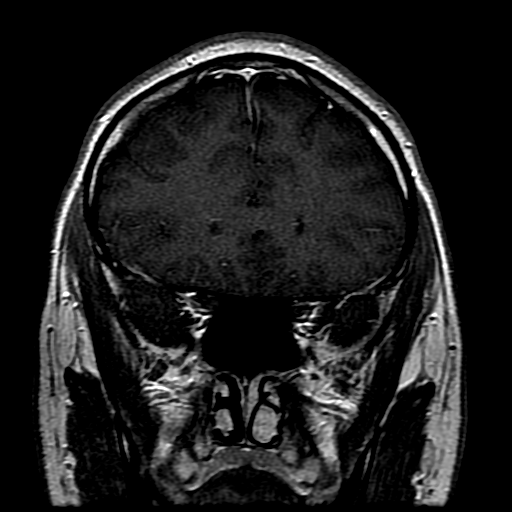

[Series 17: T1 post-contrast · coronal · 5.0mm · 0.39mm/px · 2 of 25 slices shown (3 of 3)]
[im 1/25]
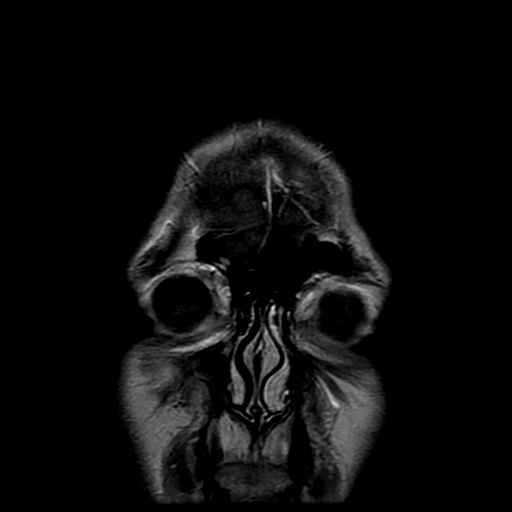
[im 25/25]
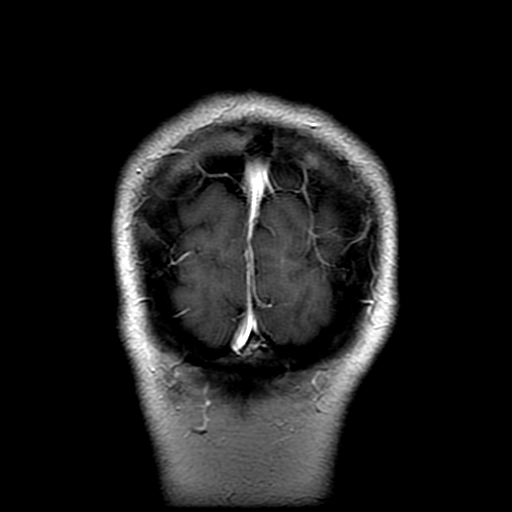

[22 of 48 positions shown; findings below may reference images not displayed]

FINDINGS: MRI HEAD FINDINGS

Brain: No acute stroke, acute hemorrhage, intra-axial mass lesion,
hydrocephalus, or extra-axial fluid. Normal for age cerebral volume.
No white matter disease.

There is a roughly spherical mass in the sella turcica, arising from
the LEFT side, with central flow void on T2 weighted images. There
is slight heterogeneity of signal within the lesion on T1 and FLAIR
imaging, consistent with swirling blood. The lesion is roughly
spherical, 12 mm diameter.

The lesion does not enhance in conjunction with pituitary. Pituitary
stalk is deviated LEFT-to-RIGHT. The lesion is outside the pituitary
gland, but does accumulate contrast on post infusion imaging. The
lesion does demonstrate mild mass effect on the pre chiasmatic
intracranial optic nerves but the chiasm appears normally located,
and surrounded by CSF.

Vascular: Carotid, basilar, vertebral flow voids are preserved. The
intrasellar structure appears to have an arterial signature, with
flow void similar to the carotid artery.

Skull and upper cervical spine: Normal marrow signal.

Sinuses/Orbits: Negative.

Other: Compared with prior CT, similar appearance.

MRA HEAD FINDINGS

The internal carotid arteries are widely patent. The basilar artery
is widely patent with vertebrals codominant. There is no
intracranial stenosis.

There is T1 shortening associated with abnormal flow related
enhancement of an intrasellar 12 mm aneurysm arising from the
superior cavernous segment of the LEFT internal carotid artery.
There appears to have a narrow neck based on examination is series
12, image 60 axial source image.
IMPRESSION: 12 mm intrasellar saccular aneurysm arising from the superior
cavernous segment LEFT internal carotid artery. The pituitary gland
is displaced LEFT-to-RIGHT, in the stalk is stretched over the dome
of the aneurysm. There is no intrinsic pituitary tumor.

Mild mass effect on the pre chiasmatic intracranial optic nerves,
but the chiasm appears normally located.

Neuro-Interventional Radiology consultation is suggested to evaluate
the appropriateness of potential treatment. Non-emergent evaluation
can be arranged by calling [phone] during usual hours.
Emergency evaluation can be requested by paging [phone].

## 2019-08-25 ENCOUNTER — Other Ambulatory Visit: Payer: Self-pay

## 2019-08-25 ENCOUNTER — Ambulatory Visit: Payer: Self-pay

## 2019-08-25 ENCOUNTER — Other Ambulatory Visit: Payer: Self-pay | Admitting: Family Medicine

## 2019-08-25 DIAGNOSIS — M79644 Pain in right finger(s): Secondary | ICD-10-CM

## 2019-11-19 ENCOUNTER — Encounter (INDEPENDENT_AMBULATORY_CARE_PROVIDER_SITE_OTHER): Payer: Self-pay

## 2019-11-19 ENCOUNTER — Ambulatory Visit (INDEPENDENT_AMBULATORY_CARE_PROVIDER_SITE_OTHER): Payer: PRIVATE HEALTH INSURANCE | Admitting: Adult Health

## 2019-11-19 ENCOUNTER — Other Ambulatory Visit: Payer: Self-pay

## 2019-11-19 ENCOUNTER — Encounter: Payer: Self-pay | Admitting: Adult Health

## 2019-11-19 VITALS — BP 128/78 | HR 68 | Ht 69.0 in | Wt 170.0 lb

## 2019-11-19 DIAGNOSIS — F909 Attention-deficit hyperactivity disorder, unspecified type: Secondary | ICD-10-CM | POA: Diagnosis not present

## 2019-11-19 MED ORDER — LISDEXAMFETAMINE DIMESYLATE 30 MG PO CAPS: 30.0000 mg | ORAL_CAPSULE | Freq: Every day | ORAL | 0 refills | Status: DC

## 2019-11-19 NOTE — Progress Notes (Signed)
[facility] MD/PA/NP Initial Note  11/19/2019 3:52 PM [name]  MRN:  [id number]  Chief Complaint:   HPI:   Describes mood today as "ok". Pleasnat. Mood symptoms - reports some depression, anxiety at times, and irritability. Stating "I'm more concerned about my lack of focus". Feels like it's effecting his business and personal relationships. Concerned he may have ADHD. Brother with ADHD. Stable interest and motivation. Taking medications as prescribed.  Energy levels stable. Active, does not have a regular exercise routine. Walks 25,000 steps a day.  Enjoys some usual interests and activities. Spending time with family. Appetite adequate. Weight stable. Sleeps well most nights. Averages 6 to 7 hours. Focus and concentration difficulties - "having issues way longer than I would like to admit". Easily distracted. Has always worked in jobs where he had the time to be distracted. Stating "I don't have that time now". Works at [employer] improvement and his own business as a Academic librarian. Has had issues since childhood. Completing tasks. Managing aspects of household.  Denies SI or HI. Denies AH or VH.  Previous medication trials: Denies  Visit Diagnosis:    ICD-10-CM   1. Attention deficit hyperactivity disorder (ADHD), unspecified ADHD type  F90.9 lisdexamfetamine (VYVANSE) 30 MG capsule    Past Psychiatric History: Denies psychiatric hospitalization.  Past Medical History:  Past Medical History:  Diagnosis Date  . Headache     Past Surgical History:  Procedure Laterality Date  . CRANIOTOMY N/A 08/09/2017   Procedure: Transsphenoidal resection of pituitary tumor with Dr. [name];  Surgeon: Erline Levine, MD;  Location: [facility];  Service: Neurosurgery;  Laterality: N/A;  Transsphenoidal resection of pituitary tumor with Dr. [name]  . IR ANGIO INTRA EXTRACRAN SEL COM CAROTID INNOMINATE BILAT MOD SED  08/08/2017  . IR ANGIO VERTEBRAL SEL VERTEBRAL BILAT MOD SED   08/08/2017  . TRANSNASAL APPROACH N/A 08/09/2017   Procedure: TRANSNASAL APPROACH;  Surgeon: [name], MD;  Location: [facility] OR;  Service: ENT;  Laterality: N/A;    Family Psychiatric History: Brother - Bipolardisorder, depression, and ADHD  Family History:  Family History  Problem Relation Age of Onset  . Diabetes Mellitus I Sister   . CAD Other     Social History:  Social History   Socioeconomic History  . Marital status: Single    Spouse name: Not on file  . Number of children: Not on file  . Years of education: Not on file  . Highest education level: Not on file  Occupational History  . Not on file  Tobacco Use  . Smoking status: Never Smoker  . Smokeless tobacco: Never Used  Substance and Sexual Activity  . Alcohol use: Yes    Alcohol/week: 2.0 standard drinks    Types: 2 Standard drinks or equivalent per week    Comment: occasional  . Drug use: No  . Sexual activity: Yes    Birth control/protection: Condom  Other Topics Concern  . Not on file  Social History Narrative  . Not on file   Social Determinants of Health   Financial Resource Strain:   . Difficulty of Paying Living Expenses:   Food Insecurity:   . Worried About Charity fundraiser in the Last Year:   . Arboriculturist in the Last Year:   Transportation Needs:   . Film/video editor (Medical):   Marland Kitchen Lack of Transportation (Non-Medical):   Physical Activity:   . Days of Exercise per Week:   . Minutes  of Exercise per Session:   Stress:   . Feeling of Stress :   Social Connections:   . Frequency of Communication with Friends and Family:   . Frequency of Social Gatherings with Friends and Family:   . Attends Religious Services:   . Active Member of Clubs or Organizations:   . Attends Archivist Meetings:   Marland Kitchen Marital Status:     Allergies:  Allergies  Allergen Reactions  . Codeine Nausea And Vomiting    Metabolic Disorder Labs: Lab Results  Component Value Date   HGBA1C  4.8 08/08/2017   MPG 91.06 08/08/2017   Lab Results  Component Value Date   PROLACTIN 7.6 08/09/2017   PROLACTIN 5.2 08/08/2017   No results found for: CHOL, TRIG, HDL, CHOLHDL, VLDL, LDLCALC Lab Results  Component Value Date   TSH 0.170 (L) 08/08/2017    Therapeutic Level Labs: No results found for: LITHIUM No results found for: VALPROATE No components found for:  CBMZ  Current Medications: Current Outpatient Medications  Medication Sig Dispense Refill  . lisdexamfetamine (VYVANSE) 30 MG capsule Take 1 capsule (30 mg total) by mouth daily. 30 capsule 0   No current facility-administered medications for this visit.    Medication Side Effects: none  Orders placed this visit:  No orders of the defined types were placed in this encounter.   Psychiatric Specialty Exam:  Review of Systems  Musculoskeletal: Negative for gait problem.  Neurological: Negative for tremors.  Psychiatric/Behavioral:       Please refer to HPI    Blood pressure 128/78, pulse 68, height 5\' 9"  (1.753 m), weight 170 lb (77.1 kg).Body mass index is 25.1 kg/m.  General Appearance: Neat and Well Groomed  Eye Contact:  Good  Speech:  Clear and Coherent and Normal Rate  Volume:  Normal  Mood:  Euthymic  Affect:  Appropriate and Congruent  Thought Process:  Coherent and Descriptions of Associations: Intact  Orientation:  Full (Time, Place, and Person)  Thought Content: Logical   Suicidal Thoughts:  No  Homicidal Thoughts:  No  Memory:  WNL  Judgement:  Good  Insight:  Good  Psychomotor Activity:  Normal  Concentration:  Concentration: Fair  Recall:  NA  Fund of Knowledge: Good  Language: Good  Assets:  Communication Skills Desire for Improvement Financial Resources/Insurance Housing Intimacy Leisure Time Physical Health Resilience Social Support Talents/Skills Transportation Vocational/Educational  ADL's:  Intact  Cognition: WNL  Prognosis:  Good   Screenings: ADHD testing  completed  Receiving Psychotherapy: No   Treatment Plan/Recommendations:  Plan:  PDMP reviewed  1. Add Vyvanse 30mg  daily   Greater than 50% of face to face time with patient was spent on counseling and coordination of care. We discussed ADHD, medications, diet, and ADDitude website. Testing done in office.  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  Discussed potential benefits, risks, and side effects of stimulants with patient to include increased heart rate, palpitations, insomnia, increased anxiety, increased irritability, or decreased appetite.  Instructed patient to contact office if experiencing any significant tolerability issues.     Aloha Gell, NP

## 2019-12-14 ENCOUNTER — Other Ambulatory Visit: Payer: Self-pay

## 2019-12-14 ENCOUNTER — Ambulatory Visit (INDEPENDENT_AMBULATORY_CARE_PROVIDER_SITE_OTHER): Payer: PRIVATE HEALTH INSURANCE | Admitting: Adult Health

## 2019-12-14 ENCOUNTER — Encounter: Payer: Self-pay | Admitting: Adult Health

## 2019-12-14 VITALS — BP 120/76 | HR 56

## 2019-12-14 DIAGNOSIS — F909 Attention-deficit hyperactivity disorder, unspecified type: Secondary | ICD-10-CM | POA: Diagnosis not present

## 2019-12-14 MED ORDER — LISDEXAMFETAMINE DIMESYLATE 40 MG PO CAPS: 40.0000 mg | ORAL_CAPSULE | ORAL | 0 refills | Status: DC

## 2019-12-14 NOTE — Progress Notes (Signed)
[name] [id number] 18-Apr-1991 29 y.o.  Subjective:   Patient ID:  [name] is a 29 y.o. (DOB [birth year]) male.  Chief Complaint: No chief complaint on file.   HPI [name] presents to the office today for follow-up of ADHD.  Describes mood today as "ok". Pleasnat. Mood symptoms - denies depression and anxiety. Irritable at times. Stating "I'm doing better". Feels like addition of Vyvanse has ben helpful. Does not feel like it is lasting throughout the day.  Stable interest and motivation. Taking medications as prescribed.  Energy levels stable. Active, does not have a regular exercise routine. Walks 25,000 steps a day.  Enjoys some usual interests and activities. Single. Lives alone. In a relationship. Family lives in [place].  Appetite adequate - "better than expected". Weight stable. Sleeps well most nights. Averages 8 to 9 hours. Focus and concentration improved. Completing tasks. Managing aspects of household. Work going well - [name]. Denies SI or HI. Denies AH or VH.  Previous medication trials: Denies  Review of Systems:  Review of Systems  Musculoskeletal: Negative for gait problem.  Neurological: Negative for tremors.  Psychiatric/Behavioral:       Please refer to HPI    Medications: I have reviewed the patient's current medications.  Current Outpatient Medications  Medication Sig Dispense Refill  . cephALEXin (KEFLEX) 500 MG capsule Take 500 mg by mouth 4 (four) times daily.    Marland Kitchen lisdexamfetamine (VYVANSE) 40 MG capsule Take 1 capsule (40 mg total) by mouth every morning. 30 capsule 0   No current facility-administered medications for this visit.    Medication Side Effects: None  Allergies:  Allergies  Allergen Reactions  . Codeine Nausea And Vomiting    Past Medical History:  Diagnosis Date  . Headache     Family History  Problem Relation Age of Onset  . Diabetes Mellitus I Sister   . CAD Other     Social History    Socioeconomic History  . Marital status: Single    Spouse name: Not on file  . Number of children: Not on file  . Years of education: Not on file  . Highest education level: Not on file  Occupational History  . Not on file  Tobacco Use  . Smoking status: Never Smoker  . Smokeless tobacco: Never Used  Substance and Sexual Activity  . Alcohol use: Yes    Alcohol/week: 2.0 standard drinks    Types: 2 Standard drinks or equivalent per week    Comment: occasional  . Drug use: No  . Sexual activity: Yes    Birth control/protection: Condom  Other Topics Concern  . Not on file  Social History Narrative  . Not on file   Social Determinants of Health   Financial Resource Strain:   . Difficulty of Paying Living Expenses:   Food Insecurity:   . Worried About Charity fundraiser in the Last Year:   . Arboriculturist in the Last Year:   Transportation Needs:   . Film/video editor (Medical):   Marland Kitchen Lack of Transportation (Non-Medical):   Physical Activity:   . Days of Exercise per Week:   . Minutes of Exercise per Session:   Stress:   . Feeling of Stress :   Social Connections:   . Frequency of Communication with Friends and Family:   . Frequency of Social Gatherings with Friends and Family:   . Attends Religious Services:   . Active Member of Clubs or Organizations:   .  Attends Archivist Meetings:   Marland Kitchen Marital Status:   Intimate Partner Violence:   . Fear of Current or Ex-Partner:   . Emotionally Abused:   Marland Kitchen Physically Abused:   . Sexually Abused:     Past Medical History, Surgical history, Social history, and Family history were reviewed and updated as appropriate.   Please see review of systems for further details on the patient's review from today.   Objective:   Physical Exam:  BP 120/76   Pulse (!) 56   Physical Exam Constitutional:      General: He is not in acute distress. Musculoskeletal:        General: No deformity.  Neurological:      Mental Status: He is alert and oriented to person, place, and time.     Coordination: Coordination normal.  Psychiatric:        Attention and Perception: Attention and perception normal. He does not perceive auditory or visual hallucinations.        Mood and Affect: Mood normal. Mood is not anxious or depressed. Affect is not labile, blunt, angry or inappropriate.        Speech: Speech normal.        Behavior: Behavior normal.        Thought Content: Thought content normal. Thought content is not paranoid or delusional. Thought content does not include homicidal or suicidal ideation. Thought content does not include homicidal or suicidal plan.        Cognition and Memory: Cognition and memory normal.        Judgment: Judgment normal.     Comments: Insight intact     Lab Review:     Component Value Date/Time   NA 135 08/11/2017 0222   K 4.5 08/11/2017 0222   CL 105 08/11/2017 0222   CO2 21 (L) 08/11/2017 0222   GLUCOSE 126 (H) 08/11/2017 0222   BUN 13 08/11/2017 0222   CREATININE 0.77 08/11/2017 0222   CALCIUM 8.9 08/11/2017 0222   PROT 7.0 08/08/2017 0023   ALBUMIN 4.2 08/08/2017 0023   AST 20 08/08/2017 0023   ALT 16 (L) 08/08/2017 0023   ALKPHOS 51 08/08/2017 0023   BILITOT 0.7 08/08/2017 0023   GFRNONAA >60 08/11/2017 0222   GFRAA >60 08/11/2017 0222       Component Value Date/Time   WBC 11.0 (H) 08/11/2017 0434   RBC 4.46 08/11/2017 0434   HGB 12.8 (L) 08/11/2017 0434   HCT 38.7 (L) 08/11/2017 0434   PLT 182 08/11/2017 0434   MCV 86.8 08/11/2017 0434   MCH 28.7 08/11/2017 0434   MCHC 33.1 08/11/2017 0434   RDW 12.3 08/11/2017 0434   LYMPHSABS 0.6 (L) 08/07/2017 2042   MONOABS 0.1 08/07/2017 2042   EOSABS 0.0 08/07/2017 2042   BASOSABS 0.0 08/07/2017 2042    No results found for: POCLITH, LITHIUM   No results found for: PHENYTOIN, PHENOBARB, VALPROATE, CBMZ   .res Assessment: Plan:    Plan:  PDMP reviewed  1. 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  Discussed potential benefits, risks, and side effects of stimulants with patient to include increased heart rate, palpitations, insomnia, increased anxiety, increased irritability, or decreased appetite.  Instructed patient to contact office if experiencing any significant tolerability issues.  Diagnoses and all orders for this visit:  Attention deficit hyperactivity disorder (ADHD), unspecified ADHD type -     lisdexamfetamine (VYVANSE) 40 MG capsule; Take 1 capsule (40 mg total) by mouth every morning.     Please see After Visit Summary for patient specific instructions.  Future Appointments  Date Time Provider [place]  01/11/2020  8:20 AM Ecko Beasley, Berdie Ogren, NP [facility] None    No orders of the defined types were placed in this encounter.   -------------------------------

## 2020-01-11 ENCOUNTER — Other Ambulatory Visit: Payer: Self-pay

## 2020-01-11 ENCOUNTER — Encounter: Payer: Self-pay | Admitting: Adult Health

## 2020-01-11 ENCOUNTER — Ambulatory Visit (INDEPENDENT_AMBULATORY_CARE_PROVIDER_SITE_OTHER): Payer: PRIVATE HEALTH INSURANCE | Admitting: Adult Health

## 2020-01-11 DIAGNOSIS — F909 Attention-deficit hyperactivity disorder, unspecified type: Secondary | ICD-10-CM

## 2020-01-11 MED ORDER — AMPHETAMINE-DEXTROAMPHETAMINE 10 MG PO TABS: 10.0000 mg | ORAL_TABLET | Freq: Every day | ORAL | 0 refills | Status: DC

## 2020-01-11 MED ORDER — LISDEXAMFETAMINE DIMESYLATE 50 MG PO CAPS: 50.0000 mg | ORAL_CAPSULE | ORAL | 0 refills | Status: DC

## 2020-01-11 NOTE — Progress Notes (Signed)
[name] [id number] 1991-01-30 29 y.o.  Subjective:   Patient ID:  [name] is a 29 y.o. (DOB September 17, 1990) male.  Chief Complaint: No chief complaint on file.   HPI [name] to the office today for follow-up of ADHD.  Describes mood today as "ok". Pleasant. Mood symptoms - denies depression and anxiety. Still getting irritable at times - "more so in the afternoons". Stating "I do feel like the medication is helping me". Feels like Vyavnse at 40mg  wears off before [name] is finished with his day. Would like to increase dose. Stable interest and motivation. Taking medications as prescribed.  Energy levels stable. Active, has a regular exercise routine. Walks 25,000 steps a day.  Enjoys some usual interests and activities. Single. Lives alone. In a relationship. Family lives in [place].  Appetite adequate. Weight stable - 170 pounds. Sleeps well most nights. Averages 8 to 9 hours. Focus and concentration improved. Completing tasks. Managing aspects of household. Work going well - 60 hours. Denies SI or HI. Denies AH or VH.  Previous medication trials: Denies   Review of Systems:  Review of Systems  Musculoskeletal: Negative for gait problem.  Neurological: Negative for tremors.  Psychiatric/Behavioral:       Please refer to HPI    Medications: I have reviewed the patient's current medications.  Current Outpatient Medications  Medication Sig Dispense Refill  . amphetamine-dextroamphetamine (ADDERALL) 10 MG tablet Take 1 tablet (10 mg total) by mouth daily. 30 tablet 0  . cephALEXin (KEFLEX) 500 MG capsule Take 500 mg by mouth 4 (four) times daily.    Marland Kitchen lisdexamfetamine (VYVANSE) 50 MG capsule Take 1 capsule (50 mg total) by mouth every morning. 30 capsule 0   No current facility-administered medications for this visit.    Medication Side Effects: None  Allergies:  Allergies  Allergen Reactions  . Codeine Nausea And Vomiting    Past  Medical History:  Diagnosis Date  . Headache     Family History  Problem Relation Age of Onset  . Diabetes Mellitus I Sister   . CAD Other     Social History   Socioeconomic History  . Marital status: Single    Spouse name: Not on file  . Number of children: Not on file  . Years of education: Not on file  . Highest education level: Not on file  Occupational History  . Not on file  Tobacco Use  . Smoking status: Never Smoker  . Smokeless tobacco: Never Used  Substance and Sexual Activity  . Alcohol use: Yes    Alcohol/week: 2.0 standard drinks    Types: 2 Standard drinks or equivalent per week    Comment: occasional  . Drug use: No  . Sexual activity: Yes    Birth control/protection: Condom  Other Topics Concern  . Not on file  Social History Narrative  . Not on file   Social Determinants of Health   Financial Resource Strain:   . Difficulty of Paying Living Expenses:   Food Insecurity:   . Worried About Charity fundraiser in the Last Year:   . Arboriculturist in the Last Year:   Transportation Needs:   . Film/video editor (Medical):   Marland Kitchen Lack of Transportation (Non-Medical):   Physical Activity:   . Days of Exercise per Week:   . Minutes of Exercise per Session:   Stress:   . Feeling of Stress :   Social Connections:   . Frequency of Communication  with Friends and Family:   . Frequency of Social Gatherings with Friends and Family:   . Attends Religious Services:   . Active Member of Clubs or Organizations:   . Attends Archivist Meetings:   Marland Kitchen Marital Status:   Intimate Partner Violence:   . Fear of Current or Ex-Partner:   . Emotionally Abused:   Marland Kitchen Physically Abused:   . Sexually Abused:     Past Medical History, Surgical history, Social history, and Family history were reviewed and updated as appropriate.   Please see review of systems for further details on the patient's review from today.   Objective:   Physical Exam:  BP  107/66   Pulse (!) 57   Physical Exam Constitutional:      General: [name] is not in acute distress. Musculoskeletal:        General: No deformity.  Neurological:     Mental Status: [name] is alert and oriented to person, place, and time.     Coordination: Coordination normal.  Psychiatric:        Attention and Perception: Attention and perception normal. [name] does not perceive auditory or visual hallucinations.        Mood and Affect: Mood normal. Mood is not anxious or depressed. Affect is not labile, blunt, angry or inappropriate.        Speech: Speech normal.        Behavior: Behavior normal.        Thought Content: Thought content normal. Thought content is not paranoid or delusional. Thought content does not include homicidal or suicidal ideation. Thought content does not include homicidal or suicidal plan.        Cognition and Memory: Cognition and memory normal.        Judgment: Judgment normal.     Comments: Insight intact     Lab Review:     Component Value Date/Time   NA 135 08/11/2017 0222   K 4.5 08/11/2017 0222   CL 105 08/11/2017 0222   CO2 21 (L) 08/11/2017 0222   GLUCOSE 126 (H) 08/11/2017 0222   BUN 13 08/11/2017 0222   CREATININE 0.77 08/11/2017 0222   CALCIUM 8.9 08/11/2017 0222   PROT 7.0 08/08/2017 0023   ALBUMIN 4.2 08/08/2017 0023   AST 20 08/08/2017 0023   ALT 16 (L) 08/08/2017 0023   ALKPHOS 51 08/08/2017 0023   BILITOT 0.7 08/08/2017 0023   GFRNONAA >60 08/11/2017 0222   GFRAA >60 08/11/2017 0222       Component Value Date/Time   WBC 11.0 (H) 08/11/2017 0434   RBC 4.46 08/11/2017 0434   HGB 12.8 (L) 08/11/2017 0434   HCT 38.7 (L) 08/11/2017 0434   PLT 182 08/11/2017 0434   MCV 86.8 08/11/2017 0434   MCH 28.7 08/11/2017 0434   MCHC 33.1 08/11/2017 0434   RDW 12.3 08/11/2017 0434   LYMPHSABS 0.6 (L) 08/07/2017 2042   MONOABS 0.1 08/07/2017 2042   EOSABS 0.0 08/07/2017 2042   BASOSABS 0.0 08/07/2017 2042    No results found for: POCLITH,  LITHIUM   No results found for: PHENYTOIN, PHENOBARB, VALPROATE, CBMZ   .res Assessment: Plan:    Plan:  PDMP reviewed  1. Increase Vyvanse 40 to 50mg  daily 2. 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  Discussed potential benefits, risks, and side effects of stimulants with patient to include increased heart rate, palpitations, insomnia, increased anxiety, increased irritability, or decreased appetite.  Instructed patient to contact office if experiencing any significant tolerability issues.   Diagnoses and all orders for this visit:  Attention deficit hyperactivity disorder (ADHD), unspecified ADHD type -     lisdexamfetamine (VYVANSE) 50 MG capsule; Take 1 capsule (50 mg total) by mouth every morning. -     amphetamine-dextroamphetamine (ADDERALL) 10 MG tablet; Take 1 tablet (10 mg total) by mouth daily.     Please see After Visit Summary for patient specific instructions.  No future appointments.  No orders of the defined types were placed in this encounter.   -------------------------------

## 2020-02-08 ENCOUNTER — Ambulatory Visit: Payer: PRIVATE HEALTH INSURANCE | Admitting: Adult Health

## 2020-02-08 ENCOUNTER — Ambulatory Visit (INDEPENDENT_AMBULATORY_CARE_PROVIDER_SITE_OTHER): Payer: PRIVATE HEALTH INSURANCE | Admitting: Adult Health

## 2020-02-08 ENCOUNTER — Other Ambulatory Visit: Payer: Self-pay

## 2020-02-08 ENCOUNTER — Encounter: Payer: Self-pay | Admitting: Adult Health

## 2020-02-08 DIAGNOSIS — F909 Attention-deficit hyperactivity disorder, unspecified type: Secondary | ICD-10-CM | POA: Diagnosis not present

## 2020-02-08 MED ORDER — LISDEXAMFETAMINE DIMESYLATE 50 MG PO CAPS: 50.0000 mg | ORAL_CAPSULE | Freq: Every day | ORAL | 0 refills | Status: DC

## 2020-02-08 MED ORDER — AMPHETAMINE-DEXTROAMPHETAMINE 10 MG PO TABS: ORAL_TABLET | ORAL | 0 refills | Status: DC

## 2020-02-08 MED ORDER — LISDEXAMFETAMINE DIMESYLATE 50 MG PO CAPS: 50.0000 mg | ORAL_CAPSULE | ORAL | 0 refills | Status: DC

## 2020-02-08 MED ORDER — AMPHETAMINE-DEXTROAMPHETAMINE 10 MG PO TABS: 10.0000 mg | ORAL_TABLET | Freq: Every day | ORAL | 0 refills | Status: DC

## 2020-02-08 NOTE — Progress Notes (Signed)
Xzaviar Maloof [id number] 02-11-1991 29 y.o.  Subjective:   Patient ID:  [name] is a 29 y.o. (DOB [birth year]) male.  Chief Complaint: No chief complaint on file.   HPI [name] presents to the office today for follow-up of ADHD.  Describes mood today as "ok". Pleasant. Mood symptoms - denies depression and anxiety. Decreased irritability - "has times when his patience is short" - usually at work. Feels like the change in medications has been helpful. Going to a comedy show tonight. Stable interest and motivation. Taking medications as prescribed.  Energy levels stable. Active, has a regular exercise routine. Walking at least 25,000 steps a shift. Enjoys some usual interests and activities. Single. Lives alone with [place] - 6 years. In a relationship. Family lives in [place].  Appetite adequate. Weight stable - 170 pounds. Sleeps well most nights. Averages 6 to 8 hours. Focus and concentration improved. Completing tasks. Managing aspects of household. Work going well - 52 hours last week at [employer]. Denies SI or HI. Denies AH or VH.  Previous medication trials: Denies  Review of Systems:  Review of Systems  Musculoskeletal: Negative for gait problem.  Neurological: Negative for tremors.  Psychiatric/Behavioral:       Please refer to HPI    Medications: I have reviewed the patient's current medications.  Current Outpatient Medications  Medication Sig Dispense Refill  . amphetamine-dextroamphetamine (ADDERALL) 10 MG tablet Take 1 tablet (10 mg total) by mouth daily. 30 tablet 0  . [START ON 03/07/2020] amphetamine-dextroamphetamine (ADDERALL) 10 MG tablet TAKE ONE TABLET DAILY. 30 tablet 0  . [START ON 04/04/2020] amphetamine-dextroamphetamine (ADDERALL) 10 MG tablet TAKE ONE TABLET DAILY. 30 tablet 0  . cephALEXin (KEFLEX) 500 MG capsule Take 500 mg by mouth 4 (four) times daily.    Marland Kitchen lisdexamfetamine (VYVANSE) 50 MG capsule Take 1 capsule (50 mg  total) by mouth every morning. 30 capsule 0  . [START ON 03/07/2020] lisdexamfetamine (VYVANSE) 50 MG capsule Take 1 capsule (50 mg total) by mouth daily. 30 capsule 0  . [START ON 04/04/2020] lisdexamfetamine (VYVANSE) 50 MG capsule Take 1 capsule (50 mg total) by mouth daily. 30 capsule 0   No current facility-administered medications for this visit.    Medication Side Effects: None  Allergies:  Allergies  Allergen Reactions  . Codeine Nausea And Vomiting    Past Medical History:  Diagnosis Date  . Headache     Family History  Problem Relation Age of Onset  . Diabetes Mellitus I Sister   . CAD Other     Social History   Socioeconomic History  . Marital status: Single    Spouse name: Not on file  . Number of children: Not on file  . Years of education: Not on file  . Highest education level: Not on file  Occupational History  . Not on file  Tobacco Use  . Smoking status: Never Smoker  . Smokeless tobacco: Never Used  Substance and Sexual Activity  . Alcohol use: Yes    Alcohol/week: 2.0 standard drinks    Types: 2 Standard drinks or equivalent per week    Comment: occasional  . Drug use: No  . Sexual activity: Yes    Birth control/protection: Condom  Other Topics Concern  . Not on file  Social History Narrative  . Not on file   Social Determinants of Health   Financial Resource Strain:   . Difficulty of Paying Living Expenses:   Food Insecurity:   . Worried About  Running Out of Food in the Last Year:   . [place] in the Last Year:   Transportation Needs:   . Lack of Transportation (Medical):   Marland Kitchen Lack of Transportation (Non-Medical):   Physical Activity:   . Days of Exercise per Week:   . Minutes of Exercise per Session:   Stress:   . Feeling of Stress :   Social Connections:   . Frequency of Communication with Friends and Family:   . Frequency of Social Gatherings with Friends and Family:   . Attends Religious Services:   . Active Member  of Clubs or Organizations:   . Attends Archivist Meetings:   Marland Kitchen Marital Status:   Intimate Partner Violence:   . Fear of Current or Ex-Partner:   . Emotionally Abused:   Marland Kitchen Physically Abused:   . Sexually Abused:     Past Medical History, Surgical history, Social history, and Family history were reviewed and updated as appropriate.   Please see review of systems for further details on the patient's review from today.   Objective:   Physical Exam:  BP 109/66   Pulse 76   Wt 170 lb (77.1 kg)   BMI 25.10 kg/m   Physical Exam Constitutional:      General: He is not in acute distress. Musculoskeletal:        General: No deformity.  Neurological:     Mental Status: He is alert and oriented to person, place, and time.     Coordination: Coordination normal.  Psychiatric:        Attention and Perception: Attention and perception normal. He does not perceive auditory or visual hallucinations.        Mood and Affect: Mood normal. Mood is not anxious or depressed. Affect is not labile, blunt, angry or inappropriate.        Speech: Speech normal.        Behavior: Behavior normal.        Thought Content: Thought content normal. Thought content is not paranoid or delusional. Thought content does not include homicidal or suicidal ideation. Thought content does not include homicidal or suicidal plan.        Cognition and Memory: Cognition and memory normal.        Judgment: Judgment normal.     Comments: Insight intact     Lab Review:     Component Value Date/Time   NA 135 08/11/2017 0222   K 4.5 08/11/2017 0222   CL 105 08/11/2017 0222   CO2 21 (L) 08/11/2017 0222   GLUCOSE 126 (H) 08/11/2017 0222   BUN 13 08/11/2017 0222   CREATININE 0.77 08/11/2017 0222   CALCIUM 8.9 08/11/2017 0222   PROT 7.0 08/08/2017 0023   ALBUMIN 4.2 08/08/2017 0023   AST 20 08/08/2017 0023   ALT 16 (L) 08/08/2017 0023   ALKPHOS 51 08/08/2017 0023   BILITOT 0.7 08/08/2017 0023   GFRNONAA  >60 08/11/2017 0222   GFRAA >60 08/11/2017 0222       Component Value Date/Time   WBC 11.0 (H) 08/11/2017 0434   RBC 4.46 08/11/2017 0434   HGB 12.8 (L) 08/11/2017 0434   HCT 38.7 (L) 08/11/2017 0434   PLT 182 08/11/2017 0434   MCV 86.8 08/11/2017 0434   MCH 28.7 08/11/2017 0434   MCHC 33.1 08/11/2017 0434   RDW 12.3 08/11/2017 0434   LYMPHSABS 0.6 (L) 08/07/2017 2042   MONOABS 0.1 08/07/2017 2042   EOSABS 0.0 08/07/2017 2042  BASOSABS 0.0 08/07/2017 2042    No results found for: POCLITH, LITHIUM   No results found for: PHENYTOIN, PHENOBARB, VALPROATE, CBMZ   .res Assessment: Plan:    Plan:  PDMP reviewed  1. Vyvanse 50mg  daily 2. 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  Discussed potential benefits, risks, and side effects of stimulants with patient to include increased heart rate, palpitations, insomnia, increased anxiety, increased irritability, or decreased appetite.  Instructed patient to contact office if experiencing any significant tolerability issues.   Diagnoses and all orders for this visit:  Attention deficit hyperactivity disorder (ADHD), unspecified ADHD type -     lisdexamfetamine (VYVANSE) 50 MG capsule; Take 1 capsule (50 mg total) by mouth every morning. -     amphetamine-dextroamphetamine (ADDERALL) 10 MG tablet; Take 1 tablet (10 mg total) by mouth daily. -     lisdexamfetamine (VYVANSE) 50 MG capsule; Take 1 capsule (50 mg total) by mouth daily. -     lisdexamfetamine (VYVANSE) 50 MG capsule; Take 1 capsule (50 mg total) by mouth daily. -     amphetamine-dextroamphetamine (ADDERALL) 10 MG tablet; TAKE ONE TABLET DAILY. -     amphetamine-dextroamphetamine (ADDERALL) 10 MG tablet; TAKE ONE TABLET DAILY.     Please see After Visit Summary  for patient specific instructions.  No future appointments.  No orders of the defined types were placed in this encounter.   -------------------------------

## 2020-04-28 ENCOUNTER — Other Ambulatory Visit: Payer: Self-pay

## 2020-04-28 ENCOUNTER — Emergency Department (HOSPITAL_COMMUNITY): Payer: BC Managed Care – PPO

## 2020-04-28 ENCOUNTER — Emergency Department (HOSPITAL_COMMUNITY)
Admission: EM | Admit: 2020-04-28 | Discharge: 2020-04-28 | Disposition: A | Discharge status: Home / Self Care | Payer: BC Managed Care – PPO | Attending: Emergency Medicine | Admitting: Emergency Medicine

## 2020-04-28 DIAGNOSIS — W268XXA Contact with other sharp object(s), not elsewhere classified, initial encounter: Secondary | ICD-10-CM | POA: Diagnosis not present

## 2020-04-28 DIAGNOSIS — S51812A Laceration without foreign body of left forearm, initial encounter: Secondary | ICD-10-CM | POA: Insufficient documentation

## 2020-04-28 DIAGNOSIS — S41112A Laceration without foreign body of left upper arm, initial encounter: Secondary | ICD-10-CM

## 2020-04-28 MED ORDER — LIDOCAINE HCL (PF) 1 % IJ SOLN
2.0000 mL | Freq: Once | INTRAMUSCULAR | Status: AC
  Administered 2020-04-28: 2 mL via INTRADERMAL
  Filled 2020-04-28: qty 5

## 2020-04-28 MED ORDER — LIDOCAINE-EPINEPHRINE-TETRACAINE (LET) TOPICAL GEL
3.0000 mL | Freq: Once | TOPICAL | Status: AC
  Administered 2020-04-28: 3 mL via TOPICAL
  Filled 2020-04-28: qty 3

## 2020-04-28 NOTE — ED Provider Notes (Signed)
I have personally seen and examined the patient. I have reviewed the documentation on PMH/FH/Soc Hx. I have discussed the plan of care with the resident and patient.  I have reviewed and agree with the resident's documentation. Please see associated encounter note.  Briefly, the patient is a 29 y.o. male here with with 1 cm laceration to the left wrist vertical.  Hemostatic. Nuerovasc intact. Cut himself on metal edge.  Tetanus shot is up-to-date.  X-ray showed no retained foreign body.  Suture repair was done by my resident and patient was discharged from ED in good condition.   Lennice Sites, DO 04/28/20 0730

## 2020-04-28 NOTE — Discharge Instructions (Signed)
 ?  [name]:  Thank you for allowing [place] to take care of you today.  We hope you begin feeling better soon.  To-Do: Please follow-up with your primary doctor in 7-10 days for suture removal. Or call above to schedule an appointment with a new primary care doctor Please return to the Emergency Department or call 911 if you experience chest pain, shortness of breath, severe pain, severe fever, altered mental status, or have any reason to think that you need emergency medical care.  Thank you again.  Hope you feel better soon.

## 2020-04-28 NOTE — ED Provider Notes (Signed)
[facility] Provider Note   CSN: [id number] Arrival date & time: 04/28/20  9381     History Chief Complaint  Patient presents with  . Laceration    [name] is a 29 y.o. male.  The history is provided by the patient.  Laceration Location:  Shoulder/arm Shoulder/arm laceration location:  L wrist Length:  1 cm Depth:  Cutaneous Quality: straight   Bleeding: controlled   Time since incident:  30 minutes Laceration mechanism:  Metal edge (wire metal shelf ) Foreign body present:  Unable to specify Tetanus status:  Up to date (31 m ago) Associated symptoms: no fever        Past Medical History:  Diagnosis Date  . Headache     Patient Active Problem List   Diagnosis Date Noted  . Lesion of pituitary gland ([place]) 08/23/2017  . 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 Migraine 08/08/2017  . Low TSH level   . Hyperglycemia   . 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 CRANIOTOMY N/A 08/09/2017   Procedure: Transsphenoidal resection of pituitary tumor with Dr. [name];  Surgeon: Erline Levine, MD;  Location: [facility];  Service: Neurosurgery;  Laterality: N/A;  Transsphenoidal resection of pituitary tumor with Dr. [name]  . IR ANGIO INTRA EXTRACRAN SEL COM CAROTID INNOMINATE BILAT MOD SED  08/08/2017  . IR ANGIO VERTEBRAL SEL VERTEBRAL BILAT MOD SED  08/08/2017  . TRANSNASAL APPROACH N/A 08/09/2017   Procedure: TRANSNASAL APPROACH;  Surgeon: [name], MD;  Location: [facility];  Service: ENT;  Laterality: N/A;       Family History  Problem Relation Age of Onset  . Diabetes Mellitus I Sister   . CAD Other     Social History   Tobacco Use  . Smoking status: Never Smoker  . Smokeless tobacco: Never Used  Substance Use Topics  . Alcohol use: Yes    Alcohol/week: 2.0 standard drinks    Types: 2 Standard drinks or  equivalent per week    Comment: occasional  . Drug use: No    Home Medications Prior to Admission medications   Medication Sig Start Date End Date Taking? Authorizing Provider  amphetamine-dextroamphetamine (ADDERALL) 10 MG tablet Take 1 tablet (10 mg total) by mouth daily. 02/08/20  Yes Mozingo, Berdie Ogren, NP  ibuprofen (ADVIL) 200 MG tablet Take 400 mg by mouth every 6 (six) hours as needed for fever, headache or mild pain.   Yes [provider]  lisdexamfetamine (VYVANSE) 50 MG capsule Take 1 capsule (50 mg total) by mouth daily. 04/04/20  Yes Mozingo, Berdie Ogren, NP  Multiple Vitamin (MULTIVITAMIN WITH MINERALS) TABS tablet Take 1 tablet by mouth daily.   Yes [provider]  amphetamine-dextroamphetamine (ADDERALL) 10 MG tablet TAKE ONE TABLET DAILY. Patient not taking: Reported on 04/28/2020 03/07/20   Mozingo, Berdie Ogren, NP  amphetamine-dextroamphetamine (ADDERALL) 10 MG tablet TAKE ONE TABLET DAILY. Patient not taking: Reported on 04/28/2020 04/04/20   Mozingo, Berdie Ogren, NP  lisdexamfetamine (VYVANSE) 50 MG capsule Take 1 capsule (50 mg total) by mouth every morning. Patient not taking: Reported on 04/28/2020 02/08/20   Mozingo, Berdie Ogren, NP  lisdexamfetamine (VYVANSE) 50 MG capsule Take 1 capsule (50 mg total) by mouth daily. Patient not taking: Reported on 04/28/2020 03/07/20   Mozingo, Berdie Ogren, NP    Allergies    Codeine  Review of Systems  Review of Systems  Constitutional: Negative for chills and fever.  HENT: Negative for congestion, sore throat and trouble swallowing.   Eyes: Negative for visual disturbance.  Respiratory: Negative for cough and shortness of breath.   Cardiovascular: Negative for chest pain.  Gastrointestinal: Negative for nausea and vomiting.  Genitourinary: Negative for difficulty urinating.  Musculoskeletal: Negative for back pain and neck pain.  Skin: Positive for wound.  Neurological: Negative  for headaches.  All other systems reviewed and are negative.   Physical Exam Updated Vital Signs BP (!) 118/99   Pulse 75   Temp 98.1 F (36.7 C) (Oral)   Resp 14   Ht 5\' 10"  (1.778 m)   Wt 72.6 kg   SpO2 99%   BMI 22.96 kg/m   Physical Exam Vitals reviewed.  Constitutional:      Appearance: Normal appearance.  HENT:     Nose: Nose normal.     Mouth/Throat:     Mouth: Mucous membranes are moist.     Pharynx: Oropharynx is clear.  Eyes:     Conjunctiva/sclera: Conjunctivae normal.  Cardiovascular:     Rate and Rhythm: Normal rate.     Heart sounds: Normal heart sounds.  Pulmonary:     Effort: Pulmonary effort is normal. No respiratory distress.     Breath sounds: Normal breath sounds.  Abdominal:     General: Abdomen is flat.     Palpations: Abdomen is soft.     Tenderness: There is no abdominal tenderness.  Musculoskeletal:     Right lower leg: No edema.     Left lower leg: No edema.  Skin:    General: Skin is warm and dry.     Findings: Lesion present.          Comments: Small 1 cm linear vertical laceration on volar aspect of Left wrist   Neurological:     Mental Status: He is alert.  Psychiatric:        Mood and Affect: Mood normal.        Behavior: Behavior normal.     ED Results / Procedures / Treatments   Labs (all labs ordered are listed, but only abnormal results are displayed) Labs Reviewed - No data to display  EKG None  Radiology DG Wrist Complete Left  Result Date: 04/28/2020 CLINICAL DATA:  Laceration, evaluate for foreign body EXAM: LEFT WRIST - COMPLETE 3+ VIEW COMPARISON:  None. FINDINGS: There is no evidence of fracture or dislocation. There is no evidence of arthropathy or other focal bone abnormality. Metallic density at the base of the first proximal phalanx correlates with history of prior surgery and is likely a collateral ligament anchor. No opaque foreign body. IMPRESSION: Negative. Electronically Signed   By: Monte Fantasia  M.D.   On: 04/28/2020 07:54    Procedures .Marland KitchenLaceration Repair  Date/Time: 04/28/2020 8:33 AM Performed by: Roosevelt Locks, MD Authorized by: Lennice Sites, DO   Anesthesia (see MAR for exact dosages):    Anesthesia method:  Local infiltration   Local anesthetic:  Lidocaine 1% w/o epi Laceration details:    Location:  Shoulder/arm   Shoulder/arm location:  L lower arm   Length (cm):  1 Repair type:    Repair type:  Simple Pre-procedure details:    Preparation:  Patient was prepped and draped in usual sterile fashion Treatment:    Area cleansed with:  Saline   Irrigation solution:  Sterile saline   Irrigation method:  Pressure wash Skin repair:  Repair method:  Sutures   Suture size:  5-0   Suture material:  Nylon   Suture technique:  Simple interrupted   Number of sutures:  1 Approximation:    Approximation:  Close Post-procedure details:    Dressing:  Non-adherent dressing   Patient tolerance of procedure:  Tolerated well, no immediate complications   (including critical care time)  Medications Ordered in ED Medications  lidocaine-EPINEPHrine-tetracaine (LET) topical gel (3 mLs Topical Given 04/28/20 0731)  lidocaine (PF) (XYLOCAINE) 1 % injection 2 mL (2 mLs Intradermal Given 04/28/20 6468)    ED Course  I have reviewed the triage vital signs and the nursing notes.  Pertinent labs & imaging results that were available during my care of the patient were reviewed by me and considered in my medical decision making (see chart for details).    MDM Rules/Calculators/A&P                          Medical Decision Making: Keyante Durio is a 29 y.o. male who presented to the ED today with  laceration on L wrist from metal edge.  Past medical history significant for headaches s/p craniotomy for pituitary adenoma. Reviewed and confirmed nursing documentation for past medical history, family history, social history.  On my initial exam, the pt was in NAD, small  linear vertical lac on volar wrist, neurovascularly intact.   Irrigated and explored, superficial injury, no deep structure involvement, normal ROM in wrist and hand, normal pulses.  Xray shows no foreign body.   Wrist lac repaired as above.   Consults: none performed All radiology and laboratory studies reviewed independently and with my attending physician, agree with reading provided by radiologist unless otherwise noted.   Upon reassessing patient, patient was resting comfortably.  Based on the above findings, I believe patient is hemodynamically stable for discharge  Patient/and family educated about specific return precautions for given chief complaint and symptoms.  Patient/and family educated about follow-up with PCP and have sutures removed in 7-10 days.  Patient/and family expressed understanding of return precautions and need for follow-up.  Patient discharged.   The above care was discussed with and agreed upon by my attending physician.     Emergency Department Medication Summary:  Medications  lidocaine-EPINEPHrine-tetracaine (LET) topical gel (3 mLs Topical Given 04/28/20 0731)  lidocaine (PF) (XYLOCAINE) 1 % injection 2 mL (2 mLs Intradermal Given 04/28/20 0834)          Final Clinical Impression(s) / ED Diagnoses Final diagnoses:  Laceration of arm, left, initial encounter    Rx / DC Orders ED Discharge Orders    None       Roosevelt Locks, MD 04/28/20 [place], [place], DO 04/28/20 DeBary, [place], DO 04/28/20 1141

## 2020-04-28 NOTE — ED Triage Notes (Signed)
Pt with approx 1 cm lac to L wrist 30 mins PTA from a piece of metal. Bleeding controlled. Last tetanus 6 months ago per pt.

## 2020-04-28 NOTE — ED Notes (Signed)
Pt discharge instructions and follow-up care reviewed with the patient. The patient verbalized understanding of both. Pt discharged. 

## 2020-05-09 ENCOUNTER — Encounter: Payer: Self-pay | Admitting: Adult Health

## 2020-05-09 ENCOUNTER — Other Ambulatory Visit: Payer: Self-pay

## 2020-05-09 ENCOUNTER — Ambulatory Visit (INDEPENDENT_AMBULATORY_CARE_PROVIDER_SITE_OTHER): Payer: PRIVATE HEALTH INSURANCE | Admitting: Adult Health

## 2020-05-09 DIAGNOSIS — F909 Attention-deficit hyperactivity disorder, unspecified type: Secondary | ICD-10-CM

## 2020-05-09 MED ORDER — LISDEXAMFETAMINE DIMESYLATE 50 MG PO CAPS: 50.0000 mg | ORAL_CAPSULE | Freq: Every day | ORAL | 0 refills | Status: DC

## 2020-05-09 MED ORDER — LISDEXAMFETAMINE DIMESYLATE 50 MG PO CAPS: 50.0000 mg | ORAL_CAPSULE | ORAL | 0 refills | Status: DC

## 2020-05-09 MED ORDER — AMPHETAMINE-DEXTROAMPHETAMINE 10 MG PO TABS: 10.0000 mg | ORAL_TABLET | Freq: Every day | ORAL | 0 refills | Status: DC

## 2020-05-09 MED ORDER — AMPHETAMINE-DEXTROAMPHETAMINE 10 MG PO TABS: ORAL_TABLET | ORAL | 0 refills | Status: DC

## 2020-05-09 NOTE — Progress Notes (Signed)
[name] [id number] February 26, 1991 29 y.o.  Subjective:   Patient ID:  [name] is a 29 y.o. (DOB 03-01-1991) male.  Chief Complaint: No chief complaint on file.   HPI [name] presents to the office today for follow-up of ADHD.  Describes mood today as "ok". Pleasant. Mood symptoms - denies depression and anxiety. Decreased irritability - "depends on the situation". Stating "I'm doing good". Feels like medications are working well. Stable interest and motivation. Taking medications as prescribed.  Energy levels stable. Active, has a regular exercise routine. Walking at least 30,000 steps a day. Enjoys some usual interests and activities. Single. Lives alone with [place] - 6 years. In a relationship - has a girfriend. Family lives in [place].  Appetite adequate. Weight loss - 15 pounds - 155 pounds. Sleeps well most nights. Averages 6 to 8 hours. Focus and concentration improved. Completing tasks. Managing aspects of household. Work going well Therapist, nutritional. Denies SI or HI. Denies AH or VH.  Previous medication trials: Denies  Review of Systems:  Review of Systems  Musculoskeletal: Negative for gait problem.  Neurological: Negative for tremors.  Psychiatric/Behavioral:       Please refer to HPI    Medications: I have reviewed the patient's current medications.  Current Outpatient Medications  Medication Sig Dispense Refill  . amphetamine-dextroamphetamine (ADDERALL) 10 MG tablet TAKE ONE TABLET DAILY. 30 tablet 0  . [START ON 06/06/2020] amphetamine-dextroamphetamine (ADDERALL) 10 MG tablet TAKE ONE TABLET DAILY. 30 tablet 0  . [START ON 07/04/2020] amphetamine-dextroamphetamine (ADDERALL) 10 MG tablet Take 1 tablet (10 mg total) by mouth daily. 30 tablet 0  . ibuprofen (ADVIL) 200 MG tablet Take 400 mg by mouth every 6 (six) hours as needed for fever, headache or mild pain.    Marland Kitchen lisdexamfetamine (VYVANSE) 50 MG capsule Take 1 capsule (50 mg total) by  mouth every morning. 30 capsule 0  . [START ON 06/06/2020] lisdexamfetamine (VYVANSE) 50 MG capsule Take 1 capsule (50 mg total) by mouth daily. 30 capsule 0  . [START ON 07/04/2020] lisdexamfetamine (VYVANSE) 50 MG capsule Take 1 capsule (50 mg total) by mouth daily. 30 capsule 0  . Multiple Vitamin (MULTIVITAMIN WITH MINERALS) TABS tablet Take 1 tablet by mouth daily.     No current facility-administered medications for this visit.    Medication Side Effects: None  Allergies:  Allergies  Allergen Reactions  . Codeine Nausea And Vomiting    Past Medical History:  Diagnosis Date  . Headache     Family History  Problem Relation Age of Onset  . Diabetes Mellitus I Sister   . CAD Other     Social History   Socioeconomic History  . Marital status: Single    Spouse name: Not on file  . Number of children: Not on file  . Years of education: Not on file  . Highest education level: Not on file  Occupational History  . Not on file  Tobacco Use  . Smoking status: Never Smoker  . Smokeless tobacco: Never Used  Substance and Sexual Activity  . Alcohol use: Yes    Alcohol/week: 2.0 standard drinks    Types: 2 Standard drinks or equivalent per week    Comment: occasional  . Drug use: No  . Sexual activity: Yes    Birth control/protection: Condom  Other Topics Concern  . Not on file  Social History Narrative  . Not on file   Social Determinants of Health   Financial Resource Strain:   .  Difficulty of Paying Living Expenses: Not on file  Food Insecurity:   . Worried About Charity fundraiser in the Last Year: Not on file  . Ran Out of Food in the Last Year: Not on file  Transportation Needs:   . Lack of Transportation (Medical): Not on file  . Lack of Transportation (Non-Medical): Not on file  Physical Activity:   . Days of Exercise per Week: Not on file  . Minutes of Exercise per Session: Not on file  Stress:   . Feeling of Stress : Not on file  Social Connections:    . Frequency of Communication with Friends and Family: Not on file  . Frequency of Social Gatherings with Friends and Family: Not on file  . Attends Religious Services: Not on file  . Active Member of Clubs or Organizations: Not on file  . Attends Archivist Meetings: Not on file  . Marital Status: Not on file  Intimate Partner Violence:   . Fear of Current or Ex-Partner: Not on file  . Emotionally Abused: Not on file  . Physically Abused: Not on file  . Sexually Abused: Not on file    Past Medical History, Surgical history, Social history, and Family history were reviewed and updated as appropriate.   Please see review of systems for further details on the patient's review from today.   Objective:   Physical Exam:  BP 120/76   Pulse 80   Wt 155 lb (70.3 kg)   BMI 22.24 kg/m   Physical Exam Constitutional:      General: He is not in acute distress. Musculoskeletal:        General: No deformity.  Neurological:     Mental Status: He is alert and oriented to person, place, and time.     Coordination: Coordination normal.  Psychiatric:        Attention and Perception: Attention and perception normal. He does not perceive auditory or visual hallucinations.        Mood and Affect: Mood normal. Mood is not anxious or depressed. Affect is not labile, blunt, angry or inappropriate.        Speech: Speech normal.        Behavior: Behavior normal.        Thought Content: Thought content normal. Thought content is not paranoid or delusional. Thought content does not include homicidal or suicidal ideation. Thought content does not include homicidal or suicidal plan.        Cognition and Memory: Cognition and memory normal.        Judgment: Judgment normal.     Comments: Insight intact     Lab Review:     Component Value Date/Time   NA 135 08/11/2017 0222   K 4.5 08/11/2017 0222   CL 105 08/11/2017 0222   CO2 21 (L) 08/11/2017 0222   GLUCOSE 126 (H) 08/11/2017 0222    BUN 13 08/11/2017 0222   CREATININE 0.77 08/11/2017 0222   CALCIUM 8.9 08/11/2017 0222   PROT 7.0 08/08/2017 0023   ALBUMIN 4.2 08/08/2017 0023   AST 20 08/08/2017 0023   ALT 16 (L) 08/08/2017 0023   ALKPHOS 51 08/08/2017 0023   BILITOT 0.7 08/08/2017 0023   GFRNONAA >60 08/11/2017 0222   GFRAA >60 08/11/2017 0222       Component Value Date/Time   WBC 11.0 (H) 08/11/2017 0434   RBC 4.46 08/11/2017 0434   HGB 12.8 (L) 08/11/2017 0434   HCT 38.7 (L) 08/11/2017  0434   PLT 182 08/11/2017 0434   MCV 86.8 08/11/2017 0434   MCH 28.7 08/11/2017 0434   MCHC 33.1 08/11/2017 0434   RDW 12.3 08/11/2017 0434   LYMPHSABS 0.6 (L) 08/07/2017 2042   MONOABS 0.1 08/07/2017 2042   EOSABS 0.0 08/07/2017 2042   BASOSABS 0.0 08/07/2017 2042    No results found for: POCLITH, LITHIUM   No results found for: PHENYTOIN, PHENOBARB, VALPROATE, CBMZ   .res Assessment: Plan:    Plan:  PDMP reviewed  1. Vyvanse 50mg  daily 2. 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  Discussed potential benefits, risks, and side effects of stimulants with patient to include increased heart rate, palpitations, insomnia, increased anxiety, increased irritability, or decreased appetite.  Instructed patient to contact office if experiencing any significant tolerability issues.    Diagnoses and all orders for this visit:  Attention deficit hyperactivity disorder (ADHD), unspecified ADHD type -     amphetamine-dextroamphetamine (ADDERALL) 10 MG tablet; TAKE ONE TABLET DAILY. -     amphetamine-dextroamphetamine (ADDERALL) 10 MG tablet; TAKE ONE TABLET DAILY. -     amphetamine-dextroamphetamine (ADDERALL) 10 MG tablet; Take 1 tablet (10 mg total) by mouth daily. -     lisdexamfetamine (VYVANSE) 50 MG  capsule; Take 1 capsule (50 mg total) by mouth every morning. -     lisdexamfetamine (VYVANSE) 50 MG capsule; Take 1 capsule (50 mg total) by mouth daily. -     lisdexamfetamine (VYVANSE) 50 MG capsule; Take 1 capsule (50 mg total) by mouth daily.     Please see After Visit Summary for patient specific instructions.  No future appointments.  No orders of the defined types were placed in this encounter.   -------------------------------

## 2020-08-08 ENCOUNTER — Encounter: Payer: Self-pay | Admitting: Adult Health

## 2020-08-08 ENCOUNTER — Other Ambulatory Visit: Payer: Self-pay

## 2020-08-08 ENCOUNTER — Ambulatory Visit (INDEPENDENT_AMBULATORY_CARE_PROVIDER_SITE_OTHER): Payer: BC Managed Care – PPO | Admitting: Adult Health

## 2020-08-08 DIAGNOSIS — F909 Attention-deficit hyperactivity disorder, unspecified type: Secondary | ICD-10-CM | POA: Diagnosis not present

## 2020-08-08 MED ORDER — LISDEXAMFETAMINE DIMESYLATE 50 MG PO CAPS: 50.0000 mg | ORAL_CAPSULE | Freq: Every day | ORAL | 0 refills | Status: DC

## 2020-08-08 MED ORDER — AMPHETAMINE-DEXTROAMPHETAMINE 10 MG PO TABS: ORAL_TABLET | ORAL | 0 refills | Status: DC

## 2020-08-08 MED ORDER — LISDEXAMFETAMINE DIMESYLATE 50 MG PO CAPS: 50.0000 mg | ORAL_CAPSULE | ORAL | 0 refills | Status: DC

## 2020-08-08 MED ORDER — AMPHETAMINE-DEXTROAMPHETAMINE 10 MG PO TABS: 10.0000 mg | ORAL_TABLET | Freq: Every day | ORAL | 0 refills | Status: DC

## 2020-08-08 NOTE — Progress Notes (Signed)
[name] [id number] Jul 11, 1990 30 y.o.  Subjective:   Patient ID:  [name] is a 30 y.o. (DOB 1991/03/22) male.  Chief Complaint: No chief complaint on file.   HPI [name] presents to the office today for follow-up of ADHD.  Describes mood today as "ok". Pleasant. Mood symptoms - denies depression and anxiety. Denies irritability.  Can get "stressed at times". Work is "stressful" at times. Stating "I'm doing alright". Feels like medications continue to work well. Stable interest and motivation. Taking medications as prescribed.  Energy levels stable. Active, has a regular exercise routine. Walking at least 30,000 steps a day. Enjoys some usual interests and activities. Single. Lives alone with [name]. In a relationship. Family lives in [place].  Appetite adequate. Weight loss - 15 pounds - 155 pounds. Sleeps well most nights. Averages 6 to 8 hours. Focus and concentration improved. Completing tasks. Managing aspects of household. Works full time at [employer]. Denies SI or HI.  Denies AH or VH.  Previous medication trials: Denies       Review of Systems:  Review of Systems  Musculoskeletal: Negative for gait problem.  Neurological: Negative for tremors.  Psychiatric/Behavioral:       Please refer to HPI    Medications: I have reviewed the patient's current medications.  Current Outpatient Medications  Medication Sig Dispense Refill  . amphetamine-dextroamphetamine (ADDERALL) 10 MG tablet TAKE ONE TABLET DAILY. 30 tablet 0  . amphetamine-dextroamphetamine (ADDERALL) 10 MG tablet TAKE ONE TABLET DAILY. 30 tablet 0  . amphetamine-dextroamphetamine (ADDERALL) 10 MG tablet Take 1 tablet (10 mg total) by mouth daily. 30 tablet 0  . ibuprofen (ADVIL) 200 MG tablet Take 400 mg by mouth every 6 (six) hours as needed for fever, headache or mild pain.    Marland Kitchen lisdexamfetamine (VYVANSE) 50 MG capsule Take 1 capsule (50 mg total) by mouth every  morning. 30 capsule 0  . lisdexamfetamine (VYVANSE) 50 MG capsule Take 1 capsule (50 mg total) by mouth daily. 30 capsule 0  . lisdexamfetamine (VYVANSE) 50 MG capsule Take 1 capsule (50 mg total) by mouth daily. 30 capsule 0  . Multiple Vitamin (MULTIVITAMIN WITH MINERALS) TABS tablet Take 1 tablet by mouth daily.     No current facility-administered medications for this visit.    Medication Side Effects: None  Allergies:  Allergies  Allergen Reactions  . Codeine Nausea And Vomiting    Past Medical History:  Diagnosis Date  . Headache     Family History  Problem Relation Age of Onset  . Diabetes Mellitus I Sister   . CAD Other     Social History   Socioeconomic History  . Marital status: Single    Spouse name: Not on file  . Number of children: Not on file  . Suarez of education: Not on file  . Highest education level: Not on file  Occupational History  . Not on file  Tobacco Use  . Smoking status: Never Smoker  . Smokeless tobacco: Never Used  Substance and Sexual Activity  . Alcohol use: Yes    Alcohol/week: 2.0 standard drinks    Types: 2 Standard drinks or equivalent per week    Comment: occasional  . Drug use: No  . Sexual activity: Yes    Birth control/protection: Condom  Other Topics Concern  . Not on file  Social History Narrative  . Not on file   Social Determinants of Health   Financial Resource Strain: Not on file  Food  Insecurity: Not on file  Transportation Needs: Not on file  Physical Activity: Not on file  Stress: Not on file  Social Connections: Not on file  Intimate Partner Violence: Not on file    Past Medical History, Surgical history, Social history, and Family history were reviewed and updated as appropriate.   Please see review of systems for further details on the patient's review from today.   Objective:   Physical Exam:  There were no vitals taken for this visit.  Physical Exam Constitutional:      General: He is not  in acute distress. Musculoskeletal:        General: No deformity.  Neurological:     Mental Status: He is alert and oriented to person, place, and time.     Coordination: Coordination normal.  Psychiatric:        Attention and Perception: Attention and perception normal. He does not perceive auditory or visual hallucinations.        Mood and Affect: Mood normal. Mood is not anxious or depressed. Affect is not labile, blunt, angry or inappropriate.        Speech: Speech normal.        Behavior: Behavior normal.        Thought Content: Thought content normal. Thought content is not paranoid or delusional. Thought content does not include homicidal or suicidal ideation. Thought content does not include homicidal or suicidal plan.        Cognition and Memory: Cognition and memory normal.        Judgment: Judgment normal.     Comments: Insight intact     Lab Review:     Component Value Date/Time   NA 135 08/11/2017 0222   K 4.5 08/11/2017 0222   CL 105 08/11/2017 0222   CO2 21 (L) 08/11/2017 0222   GLUCOSE 126 (H) 08/11/2017 0222   BUN 13 08/11/2017 0222   CREATININE 0.77 08/11/2017 0222   CALCIUM 8.9 08/11/2017 0222   PROT 7.0 08/08/2017 0023   ALBUMIN 4.2 08/08/2017 0023   AST 20 08/08/2017 0023   ALT 16 (L) 08/08/2017 0023   ALKPHOS 51 08/08/2017 0023   BILITOT 0.7 08/08/2017 0023   GFRNONAA >60 08/11/2017 0222   GFRAA >60 08/11/2017 0222       Component Value Date/Time   WBC 11.0 (H) 08/11/2017 0434   RBC 4.46 08/11/2017 0434   HGB 12.8 (L) 08/11/2017 0434   HCT 38.7 (L) 08/11/2017 0434   PLT 182 08/11/2017 0434   MCV 86.8 08/11/2017 0434   MCH 28.7 08/11/2017 0434   MCHC 33.1 08/11/2017 0434   RDW 12.3 08/11/2017 0434   LYMPHSABS 0.6 (L) 08/07/2017 2042   MONOABS 0.1 08/07/2017 2042   EOSABS 0.0 08/07/2017 2042   BASOSABS 0.0 08/07/2017 2042    No results found for: POCLITH, LITHIUM   No results found for: PHENYTOIN, PHENOBARB, VALPROATE, CBMZ    .res Assessment: Plan:    Plan:  PDMP reviewed  1. Vyvanse 50mg  daily 2. 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  Discussed potential benefits, risks, and side effects of stimulants with patient to include increased heart rate, palpitations, insomnia, increased anxiety, increased irritability, or decreased appetite.  Instructed patient to contact office if experiencing any significant tolerability issues.   Diagnoses  and all orders for this visit:  Attention deficit hyperactivity disorder (ADHD), unspecified ADHD type     Please see After Visit Summary for patient specific instructions.  No future appointments.  No orders of the defined types were placed in this encounter.   -------------------------------

## 2020-10-31 ENCOUNTER — Encounter: Payer: Self-pay | Admitting: Adult Health

## 2020-10-31 ENCOUNTER — Ambulatory Visit (INDEPENDENT_AMBULATORY_CARE_PROVIDER_SITE_OTHER): Payer: BC Managed Care – PPO | Admitting: Adult Health

## 2020-10-31 ENCOUNTER — Other Ambulatory Visit: Payer: Self-pay

## 2020-10-31 DIAGNOSIS — F909 Attention-deficit hyperactivity disorder, unspecified type: Secondary | ICD-10-CM | POA: Diagnosis not present

## 2020-10-31 MED ORDER — LISDEXAMFETAMINE DIMESYLATE 50 MG PO CAPS: 50.0000 mg | ORAL_CAPSULE | Freq: Every day | ORAL | 0 refills | Status: DC

## 2020-10-31 MED ORDER — LISDEXAMFETAMINE DIMESYLATE 50 MG PO CAPS: 50.0000 mg | ORAL_CAPSULE | ORAL | 0 refills | Status: DC

## 2020-10-31 MED ORDER — AMPHETAMINE-DEXTROAMPHETAMINE 10 MG PO TABS: ORAL_TABLET | ORAL | 0 refills | Status: DC

## 2020-10-31 MED ORDER — AMPHETAMINE-DEXTROAMPHETAMINE 10 MG PO TABS: 10.0000 mg | ORAL_TABLET | Freq: Every day | ORAL | 0 refills | Status: DC

## 2020-10-31 NOTE — Progress Notes (Signed)
Cashtyn Pouliot [id number] April 08, 1991 30 y.o.  Subjective:   Patient ID:  [name] is a 30 y.o. (DOB 19-Feb-1991) male.  Chief Complaint: No chief complaint on file.   HPI [name] presents to the office today for follow-up of ADHD.  Describes mood today as "ok". Pleasant. Mood symptoms - denies depression and anxiety. Denies irritability - gets stressed at times. Stating "I'm doing alright". Reports ongoing stressors in work setting. Plans to travel to [place] to see family. Feels like medications continue to work well. Stable interest and motivation. Taking medications as prescribed.  Energy levels stable. Active, has a regular exercise routine. Walking at least 25 to 30,000 steps a day. Enjoys some usual interests and activities. Single. Lives alone with [place] - 6 years. In a relationship. Family lives in [place].  Appetite adequate. Weight stabilized - 160 pounds. Sleeps well most nights. Averages 6 to 8 hours. Focus and concentration stable. Completing tasks. Managing aspects of household. Works full time at [employer]. Denies SI or HI.  Denies AH or VH.  Previous medication trials: Denies  Review of Systems:  Review of Systems  Musculoskeletal: Negative for gait problem.  Neurological: Negative for tremors.  Psychiatric/Behavioral:       Please refer to HPI    Medications: I have reviewed the patient's current medications.  Current Outpatient Medications  Medication Sig Dispense Refill  . amphetamine-dextroamphetamine (ADDERALL) 10 MG tablet Take 1 tablet (10 mg total) by mouth daily. 30 tablet 0  . [START ON 11/28/2020] amphetamine-dextroamphetamine (ADDERALL) 10 MG tablet TAKE ONE TABLET DAILY. 30 tablet 0  . [START ON 12/26/2020] amphetamine-dextroamphetamine (ADDERALL) 10 MG tablet TAKE ONE TABLET DAILY. 30 tablet 0  . ibuprofen (ADVIL) 200 MG tablet Take 400 mg by mouth every 6 (six) hours as needed for fever, headache or mild pain.    Marland Kitchen  lisdexamfetamine (VYVANSE) 50 MG capsule Take 1 capsule (50 mg total) by mouth daily. 30 capsule 0  . [START ON 11/28/2020] lisdexamfetamine (VYVANSE) 50 MG capsule Take 1 capsule (50 mg total) by mouth daily. 30 capsule 0  . [START ON 12/26/2020] lisdexamfetamine (VYVANSE) 50 MG capsule Take 1 capsule (50 mg total) by mouth every morning. 30 capsule 0  . Multiple Vitamin (MULTIVITAMIN WITH MINERALS) TABS tablet Take 1 tablet by mouth daily.     No current facility-administered medications for this visit.    Medication Side Effects: None  Allergies:  Allergies  Allergen Reactions  . Codeine Nausea And Vomiting    Past Medical History:  Diagnosis Date  . Headache     Past Medical History, Surgical history, Social history, and Family history were reviewed and updated as appropriate.   Please see review of systems for further details on the patient's review from today.   Objective:   Physical Exam:  There were no vitals taken for this visit.  Physical Exam Constitutional:      General: He is not in acute distress. Musculoskeletal:        General: No deformity.  Neurological:     Mental Status: He is alert and oriented to person, place, and time.     Coordination: Coordination normal.  Psychiatric:        Attention and Perception: Attention and perception normal. He does not perceive auditory or visual hallucinations.        Mood and Affect: Mood normal. Mood is not anxious or depressed. Affect is not labile, blunt, angry or inappropriate.        Speech:  Speech normal.        Behavior: Behavior normal.        Thought Content: Thought content normal. Thought content is not paranoid or delusional. Thought content does not include homicidal or suicidal ideation. Thought content does not include homicidal or suicidal plan.        Cognition and Memory: Cognition and memory normal.        Judgment: Judgment normal.     Comments: Insight intact     Lab Review:     Component Value  Date/Time   NA 135 08/11/2017 0222   K 4.5 08/11/2017 0222   CL 105 08/11/2017 0222   CO2 21 (L) 08/11/2017 0222   GLUCOSE 126 (H) 08/11/2017 0222   BUN 13 08/11/2017 0222   CREATININE 0.77 08/11/2017 0222   CALCIUM 8.9 08/11/2017 0222   PROT 7.0 08/08/2017 0023   ALBUMIN 4.2 08/08/2017 0023   AST 20 08/08/2017 0023   ALT 16 (L) 08/08/2017 0023   ALKPHOS 51 08/08/2017 0023   BILITOT 0.7 08/08/2017 0023   GFRNONAA >60 08/11/2017 0222   GFRAA >60 08/11/2017 0222       Component Value Date/Time   WBC 11.0 (H) 08/11/2017 0434   RBC 4.46 08/11/2017 0434   HGB 12.8 (L) 08/11/2017 0434   HCT 38.7 (L) 08/11/2017 0434   PLT 182 08/11/2017 0434   MCV 86.8 08/11/2017 0434   MCH 28.7 08/11/2017 0434   MCHC 33.1 08/11/2017 0434   RDW 12.3 08/11/2017 0434   LYMPHSABS 0.6 (L) 08/07/2017 2042   MONOABS 0.1 08/07/2017 2042   EOSABS 0.0 08/07/2017 2042   BASOSABS 0.0 08/07/2017 2042    No results found for: POCLITH, LITHIUM   No results found for: PHENYTOIN, PHENOBARB, VALPROATE, CBMZ   .res Assessment: Plan:     Plan:  PDMP reviewed  1. Vyvanse 50mg  daily 2. 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  Discussed potential benefits, risks, and side effects of stimulants with patient to include increased heart rate, palpitations, insomnia, increased anxiety, increased irritability, or decreased appetite.  Instructed patient to contact office if experiencing any significant tolerability issues.    Diagnoses and all orders for this visit:  Attention deficit hyperactivity disorder (ADHD), unspecified ADHD type -     lisdexamfetamine (VYVANSE) 50 MG capsule; Take 1 capsule (50 mg total) by mouth daily. -     lisdexamfetamine (VYVANSE) 50 MG capsule; Take 1 capsule (50 mg  total) by mouth daily. -     lisdexamfetamine (VYVANSE) 50 MG capsule; Take 1 capsule (50 mg total) by mouth every morning. -     amphetamine-dextroamphetamine (ADDERALL) 10 MG tablet; Take 1 tablet (10 mg total) by mouth daily. -     amphetamine-dextroamphetamine (ADDERALL) 10 MG tablet; TAKE ONE TABLET DAILY. -     amphetamine-dextroamphetamine (ADDERALL) 10 MG tablet; TAKE ONE TABLET DAILY.     Please see After Visit Summary for patient specific instructions.  Future Appointments  Date Time Provider [place]  10/31/2020  8:00 AM Montoya Watkin, Berdie Ogren, NP [facility] None    No orders of the defined types were placed in this encounter.   -------------------------------

## 2021-01-30 ENCOUNTER — Ambulatory Visit: Payer: BC Managed Care – PPO | Admitting: Adult Health

## 2021-07-13 ENCOUNTER — Telehealth: Payer: Self-pay | Admitting: Adult Health

## 2021-07-13 ENCOUNTER — Telehealth: Payer: Self-pay

## 2021-07-13 ENCOUNTER — Encounter: Payer: Self-pay | Admitting: Adult Health

## 2021-07-13 ENCOUNTER — Other Ambulatory Visit: Payer: Self-pay

## 2021-07-13 ENCOUNTER — Ambulatory Visit (INDEPENDENT_AMBULATORY_CARE_PROVIDER_SITE_OTHER): Payer: BC Managed Care – PPO | Admitting: Adult Health

## 2021-07-13 DIAGNOSIS — F909 Attention-deficit hyperactivity disorder, unspecified type: Secondary | ICD-10-CM

## 2021-07-13 MED ORDER — AMPHETAMINE-DEXTROAMPHETAMINE 10 MG PO TABS: ORAL_TABLET | ORAL | 0 refills | Status: DC

## 2021-07-13 MED ORDER — LISDEXAMFETAMINE DIMESYLATE 50 MG PO CAPS: 50.0000 mg | ORAL_CAPSULE | Freq: Every day | ORAL | 0 refills | Status: DC

## 2021-07-13 MED ORDER — AMPHETAMINE-DEXTROAMPHETAMINE 10 MG PO TABS: 10.0000 mg | ORAL_TABLET | Freq: Every day | ORAL | 0 refills | Status: DC

## 2021-07-13 MED ORDER — LISDEXAMFETAMINE DIMESYLATE 50 MG PO CAPS: 50.0000 mg | ORAL_CAPSULE | ORAL | 0 refills | Status: DC

## 2021-07-13 NOTE — Telephone Encounter (Signed)
PA for Vyvanse 50MG  capsules #30 has been submitted and approved by Sherre Poot [place] Effective dates 07/13/21-07/13/22

## 2021-07-13 NOTE — Telephone Encounter (Signed)
[employer] [id number]

## 2021-07-13 NOTE — Telephone Encounter (Signed)
LVM to rtc 

## 2021-07-13 NOTE — Progress Notes (Signed)
[name] Suarez 23-Jun-1991 31 y.o.  Subjective:   Patient ID:  [name] is a 31 y.o. (DOB [birth year]) male.  Chief Complaint: No chief complaint on file.   HPI [name] presents to the office today for follow-up of ADHD.  Describes mood today as "ok". Pleasant. Mood symptoms - denies depression, anxiety and irritability. Feels like mood is stable. Lost job since last visit and is looking for another one. Stating "I'm doing ok". Visited family in [place] in October. Ran out of medications 6 months ago and would like to restart them. Stable interest and motivation. Taking medications as prescribed.  Energy levels "up and down". Active, has a regular exercise routine.  Enjoys some usual interests and activities. Has a girlfriend of 3.5 years. Lives alone with [place] - 7 years. Family lives in [place].  Appetite adequate. Weight stabilized - 170 pounds. Sleeps well most nights. Averages 6 to 8 hours. Focus and concentration difficulties without medication. Completing tasks. Managing aspects of household. Coaching (diving) and looking for jobs. Denies SI or HI.  Denies AH or VH.  Previous medication trials: Denies  Review of Systems:  Review of Systems  Musculoskeletal:  Negative for gait problem.  Neurological:  Negative for tremors.  Psychiatric/Behavioral:         Please refer to HPI   Medications: I have reviewed the patient's current medications.  Current Outpatient Medications  Medication Sig Dispense Refill   amphetamine-dextroamphetamine (ADDERALL) 10 MG tablet Take 1 tablet (10 mg total) by mouth daily. 30 tablet 0   [START ON 08/10/2021] amphetamine-dextroamphetamine (ADDERALL) 10 MG tablet TAKE ONE TABLET DAILY. 30 tablet 0   [START ON 09/07/2021] amphetamine-dextroamphetamine (ADDERALL) 10 MG tablet TAKE ONE TABLET DAILY. 30 tablet 0   ibuprofen (ADVIL) 200 MG tablet Take 400 mg by mouth every 6 (six) hours as needed for fever,  headache or mild pain.     lisdexamfetamine (VYVANSE) 50 MG capsule Take 1 capsule (50 mg total) by mouth daily. 30 capsule 0   [START ON 08/10/2021] lisdexamfetamine (VYVANSE) 50 MG capsule Take 1 capsule (50 mg total) by mouth daily. 30 capsule 0   [START ON 09/07/2021] lisdexamfetamine (VYVANSE) 50 MG capsule Take 1 capsule (50 mg total) by mouth every morning. 30 capsule 0   Multiple Vitamin (MULTIVITAMIN WITH MINERALS) TABS tablet Take 1 tablet by mouth daily.     No current facility-administered medications for this visit.    Medication Side Effects: None  Allergies:  Allergies  Allergen Reactions   Codeine Nausea And Vomiting    Past Medical History:  Diagnosis Date   Headache     Past Medical History, Surgical history, Social history, and Family history were reviewed and updated as appropriate.   Please see review of systems for further details on the patient's review from today.   Objective:   Physical Exam:  There were no vitals taken for this visit.  Physical Exam Constitutional:      General: He is not in acute distress. Musculoskeletal:        General: No deformity.  Neurological:     Mental Status: He is alert and oriented to person, place, and time.     Coordination: Coordination normal.  Psychiatric:        Attention and Perception: Attention and perception normal. He does not perceive auditory or visual hallucinations.        Mood and Affect: Mood normal. Mood is not anxious or depressed. Affect is not labile, blunt, angry  or inappropriate.        Speech: Speech normal.        Behavior: Behavior normal.        Thought Content: Thought content normal. Thought content is not paranoid or delusional. Thought content does not include homicidal or suicidal ideation. Thought content does not include homicidal or suicidal plan.        Cognition and Memory: Cognition and memory normal.        Judgment: Judgment normal.     Comments: Insight intact    Lab  Review:     Component Value Date/Time   NA 135 08/11/2017 0222   K 4.5 08/11/2017 0222   CL 105 08/11/2017 0222   CO2 21 (L) 08/11/2017 0222   GLUCOSE 126 (H) 08/11/2017 0222   BUN 13 08/11/2017 0222   CREATININE 0.77 08/11/2017 0222   CALCIUM 8.9 08/11/2017 0222   PROT 7.0 08/08/2017 0023   ALBUMIN 4.2 08/08/2017 0023   AST 20 08/08/2017 0023   ALT 16 (L) 08/08/2017 0023   ALKPHOS 51 08/08/2017 0023   BILITOT 0.7 08/08/2017 0023   GFRNONAA >60 08/11/2017 0222   GFRAA >60 08/11/2017 0222       Component Value Date/Time   WBC 11.0 (H) 08/11/2017 0434   RBC 4.46 08/11/2017 0434   HGB 12.8 (L) 08/11/2017 0434   HCT 38.7 (L) 08/11/2017 0434   PLT 182 08/11/2017 0434   MCV 86.8 08/11/2017 0434   MCH 28.7 08/11/2017 0434   MCHC 33.1 08/11/2017 0434   RDW 12.3 08/11/2017 0434   LYMPHSABS 0.6 (L) 08/07/2017 2042   MONOABS 0.1 08/07/2017 2042   EOSABS 0.0 08/07/2017 2042   BASOSABS 0.0 08/07/2017 2042    No results found for: POCLITH, LITHIUM   No results found for: PHENYTOIN, PHENOBARB, VALPROATE, CBMZ   .res Assessment: Plan:     Plan:  PDMP reviewed  1. Vyvanse 50mg  daily 2. 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  Discussed potential benefits, risks, and side effects of stimulants with patient to include increased heart rate, palpitations, insomnia, increased anxiety, increased irritability, or decreased appetite.  Instructed patient to contact office if experiencing any significant tolerability issues. Diagnoses and all orders for this visit:  Attention deficit hyperactivity disorder (ADHD), unspecified ADHD type -     amphetamine-dextroamphetamine (ADDERALL) 10 MG tablet; Take 1 tablet (10 mg total) by mouth daily. -     amphetamine-dextroamphetamine  (ADDERALL) 10 MG tablet; TAKE ONE TABLET DAILY. -     amphetamine-dextroamphetamine (ADDERALL) 10 MG tablet; TAKE ONE TABLET DAILY. -     lisdexamfetamine (VYVANSE) 50 MG capsule; Take 1 capsule (50 mg total) by mouth daily. -     lisdexamfetamine (VYVANSE) 50 MG capsule; Take 1 capsule (50 mg total) by mouth daily. -     lisdexamfetamine (VYVANSE) 50 MG capsule; Take 1 capsule (50 mg total) by mouth every morning.     Please see After Visit Summary for patient specific instructions.  No future appointments.   No orders of the defined types were placed in this encounter.   -------------------------------

## 2021-07-13 NOTE — Telephone Encounter (Signed)
Patient called in after appt today stating that when he went to pharmacy to pick up prescription he was told he needed a PA for Vyvanse 50mg . He inquired about trying something else if the PA doesn't bring down the cost enough. Pls Call if needed [phone]

## 2021-07-14 NOTE — Telephone Encounter (Signed)
Pt informed PA approved

## 2021-10-11 ENCOUNTER — Encounter: Payer: Self-pay | Admitting: Adult Health

## 2021-10-11 ENCOUNTER — Ambulatory Visit (INDEPENDENT_AMBULATORY_CARE_PROVIDER_SITE_OTHER): Payer: BLUE CROSS/BLUE SHIELD | Admitting: Adult Health

## 2021-10-11 DIAGNOSIS — F909 Attention-deficit hyperactivity disorder, unspecified type: Secondary | ICD-10-CM

## 2021-10-11 MED ORDER — AMPHETAMINE-DEXTROAMPHETAMINE 10 MG PO TABS: ORAL_TABLET | ORAL | 0 refills | Status: DC

## 2021-10-11 MED ORDER — AMPHETAMINE-DEXTROAMPHETAMINE 10 MG PO TABS: 10.0000 mg | ORAL_TABLET | Freq: Every day | ORAL | 0 refills | Status: DC

## 2021-10-11 MED ORDER — LISDEXAMFETAMINE DIMESYLATE 50 MG PO CAPS: 50.0000 mg | ORAL_CAPSULE | Freq: Every day | ORAL | 0 refills | Status: DC

## 2021-10-11 MED ORDER — LISDEXAMFETAMINE DIMESYLATE 50 MG PO CAPS: 50.0000 mg | ORAL_CAPSULE | ORAL | 0 refills | Status: DC

## 2021-10-11 NOTE — Progress Notes (Signed)
[name] ?[id number] ?[birth year] ?31 y.o. ? ?Subjective:  ? ?Patient ID:  [name] is a 31 y.o. (DOB May 09, 1991) male. ? ?Chief Complaint: No chief complaint on file. ? ? ?HPI ?[name] presents to the office today for follow-up of ADHD. ? ?Describes mood today as "ok". Pleasant. Mood symptoms - denies depression, anxiety and irritability. Feels like mood is stable. Has started a new job at [employer]. Stating "I'm doing ok". Stable interest and motivation. Taking medications as prescribed.  ?Energy levels improved. Active, has a regular exercise routine.  ?Enjoys some usual interests and activities. Has a girlfriend of 3.5 years. Lives alone with [name] - "Bear" - 7 years. Family lives in [place].  ?Appetite adequate. Weight stabilized - 170 pounds. ?Sleeps well most nights. Averages 6 to 8 hours. ?Focus and concentration difficulties without medication. Completing tasks. Managing aspects of household. Coaching (diving) and looking for jobs. ?Denies SI or HI.  ?Denies AH or VH. ? ?Previous medication trials: Denies ? ? ?  ? ?Review of Systems:  ?Review of Systems  ?Musculoskeletal:  Negative for gait problem.  ?Neurological:  Negative for tremors.  ?Psychiatric/Behavioral:    ?     Please refer to HPI  ? ?Medications: I have reviewed the patient's current medications. ? ?Current Outpatient Medications  ?Medication Sig Dispense Refill  ? amphetamine-dextroamphetamine (ADDERALL) 10 MG tablet Take 1 tablet (10 mg total) by mouth daily. 30 tablet 0  ? amphetamine-dextroamphetamine (ADDERALL) 10 MG tablet TAKE ONE TABLET DAILY. 30 tablet 0  ? amphetamine-dextroamphetamine (ADDERALL) 10 MG tablet TAKE ONE TABLET DAILY. 30 tablet 0  ? ibuprofen (ADVIL) 200 MG tablet Take 400 mg by mouth every 6 (six) hours as needed for fever, headache or mild pain.    ? lisdexamfetamine (VYVANSE) 50 MG capsule Take 1 capsule (50 mg total) by mouth daily. 30 capsule 0  ? lisdexamfetamine (VYVANSE) 50 MG  capsule Take 1 capsule (50 mg total) by mouth daily. 30 capsule 0  ? lisdexamfetamine (VYVANSE) 50 MG capsule Take 1 capsule (50 mg total) by mouth every morning. 30 capsule 0  ? Multiple Vitamin (MULTIVITAMIN WITH MINERALS) TABS tablet Take 1 tablet by mouth daily.    ? ?No current facility-administered medications for this visit.  ? ? ?Medication Side Effects: None ? ?Allergies:  ?Allergies  ?Allergen Reactions  ? Codeine Nausea And Vomiting  ? ? ?Past Medical History:  ?Diagnosis Date  ? Headache   ? ? ?Past Medical History, Surgical history, Social history, and Family history were reviewed and updated as appropriate.  ? ?Please see review of systems for further details on the patient's review from today.  ? ?Objective:  ? ?Physical Exam:  ?There were no vitals taken for this visit. ? ?Physical Exam ?Constitutional:   ?   General: He is not in acute distress. ?Musculoskeletal:     ?   General: No deformity.  ?Neurological:  ?   Mental Status: He is alert and oriented to person, place, and time.  ?   Coordination: Coordination normal.  ?Psychiatric:     ?   Attention and Perception: Attention and perception normal. He does not perceive auditory or visual hallucinations.     ?   Mood and Affect: Mood normal. Mood is not anxious or depressed. Affect is not labile, blunt, angry or inappropriate.     ?   Speech: Speech normal.     ?   Behavior: Behavior normal.     ?   Thought Content:  Thought content normal. Thought content is not paranoid or delusional. Thought content does not include homicidal or suicidal ideation. Thought content does not include homicidal or suicidal plan.     ?   Cognition and Memory: Cognition and memory normal.     ?   Judgment: Judgment normal.  ?   Comments: Insight intact  ? ? ?Lab Review:  ?   ?Component Value Date/Time  ? NA 135 08/11/2017 0222  ? K 4.5 08/11/2017 0222  ? CL 105 08/11/2017 0222  ? CO2 21 (L) 08/11/2017 0222  ? GLUCOSE 126 (H) 08/11/2017 0222  ? BUN 13 08/11/2017 0222  ?  CREATININE 0.77 08/11/2017 0222  ? CALCIUM 8.9 08/11/2017 0222  ? PROT 7.0 08/08/2017 0023  ? ALBUMIN 4.2 08/08/2017 0023  ? AST 20 08/08/2017 0023  ? ALT 16 (L) 08/08/2017 0023  ? ALKPHOS 51 08/08/2017 0023  ? BILITOT 0.7 08/08/2017 0023  ? GFRNONAA >60 08/11/2017 0222  ? GFRAA >60 08/11/2017 0222  ? ? ?   ?Component Value Date/Time  ? WBC 11.0 (H) 08/11/2017 0434  ? RBC 4.46 08/11/2017 0434  ? HGB 12.8 (L) 08/11/2017 0434  ? HCT 38.7 (L) 08/11/2017 0434  ? PLT 182 08/11/2017 0434  ? MCV 86.8 08/11/2017 0434  ? MCH 28.7 08/11/2017 0434  ? MCHC 33.1 08/11/2017 0434  ? RDW 12.3 08/11/2017 0434  ? LYMPHSABS 0.6 (L) 08/07/2017 2042  ? MONOABS 0.1 08/07/2017 2042  ? EOSABS 0.0 08/07/2017 2042  ? BASOSABS 0.0 08/07/2017 2042  ? ? ?No results found for: POCLITH, LITHIUM  ? ?No results found for: PHENYTOIN, PHENOBARB, VALPROATE, CBMZ  ? ?.res ?Assessment: Plan:   ? ?Plan: ? ?PDMP reviewed ? ?1. Vyvanse '50mg'$  daily ?2. Adderall '10mg'$  daily ? ?116/70/75 ? ?[facility] testing results 47/58 ? 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 ?RTC 3 months ? ?Patient advised to contact office with any questions, adverse effects, or acute worsening in signs and symptoms. ? ?Discussed potential benefits, risks, and side effects of stimulants with patient to include increased heart rate, palpitations, insomnia, increased anxiety, increased irritability, or decreased appetite.  Instructed patient to contact office if experiencing any significant tolerability issues. ? ? ?There are no diagnoses linked to this encounter.  ? ?Please see After Visit Summary for patient specific instructions. ? ?No future appointments. ? ?No orders of the defined types were placed in this encounter. ? ? ?------------------------------- ?

## 2022-01-10 ENCOUNTER — Ambulatory Visit (INDEPENDENT_AMBULATORY_CARE_PROVIDER_SITE_OTHER): Payer: BLUE CROSS/BLUE SHIELD | Admitting: Adult Health

## 2022-01-10 ENCOUNTER — Encounter: Payer: Self-pay | Admitting: Adult Health

## 2022-01-10 DIAGNOSIS — F909 Attention-deficit hyperactivity disorder, unspecified type: Secondary | ICD-10-CM

## 2022-01-10 MED ORDER — LISDEXAMFETAMINE DIMESYLATE 50 MG PO CAPS: 50.0000 mg | ORAL_CAPSULE | ORAL | 0 refills | Status: AC

## 2022-01-10 MED ORDER — LISDEXAMFETAMINE DIMESYLATE 50 MG PO CAPS: 50.0000 mg | ORAL_CAPSULE | Freq: Every day | ORAL | 0 refills | Status: AC

## 2022-01-10 MED ORDER — AMPHETAMINE-DEXTROAMPHETAMINE 10 MG PO TABS: 10.0000 mg | ORAL_TABLET | Freq: Every day | ORAL | 0 refills | Status: AC

## 2022-01-10 MED ORDER — AMPHETAMINE-DEXTROAMPHETAMINE 10 MG PO TABS: ORAL_TABLET | ORAL | 0 refills | Status: AC

## 2022-01-10 NOTE — Progress Notes (Signed)
[name] [id number] August 18, 1990 31 y.o.  Subjective:   Patient ID:  [name] is a 31 y.o. (DOB 19-May-1991) male.  Chief Complaint: No chief complaint on file.   HPI [name] presents to the office today for follow-up of ADHD.  Describes mood today as "ok". Pleasant. Mood symptoms - denies depression, anxiety and irritability. Mood is consistent. Stating "I'm doing ok". Planning to move to [place] in the next 3 months. 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 Taking medications as prescribed.  Energy levels improved. Active, has a regular exercise routine.  Enjoys some usual interests and activities. Single - he and girlfriend of 5 years recently broke up. Lives alone with [place] - "Bear". Family lives in [place].  Appetite adequate. Weight stabilized - 170 pounds. Sleeps well most nights. Averages 6 to 8 hours. Focus and concentration stable with medication. Completing tasks. Managing aspects of household. 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  Denies AH or VH.  Previous medication trials: Denies      Review of Systems:  Review of Systems  Musculoskeletal:  Negative for gait problem.  Neurological:  Negative for tremors.  Psychiatric/Behavioral:         Please refer to HPI    Medications: I have reviewed the patient's current medications.  Current Outpatient Medications  Medication Sig Dispense Refill   amphetamine-dextroamphetamine (ADDERALL) 10 MG tablet Take 1 tablet (10 mg total) by mouth daily. 30 tablet 0   amphetamine-dextroamphetamine (ADDERALL) 10 MG tablet TAKE ONE TABLET DAILY. 30 tablet 0   amphetamine-dextroamphetamine (ADDERALL) 10 MG tablet TAKE ONE TABLET DAILY. 30 tablet 0   ibuprofen (ADVIL) 200 MG tablet Take 400 mg by mouth every 6 (six) hours as needed for fever, headache or mild pain.     lisdexamfetamine (VYVANSE) 50 MG capsule Take 1 capsule (50 mg total) by mouth daily. 30  capsule 0   lisdexamfetamine (VYVANSE) 50 MG capsule Take 1 capsule (50 mg total) by mouth daily. 30 capsule 0   lisdexamfetamine (VYVANSE) 50 MG capsule Take 1 capsule (50 mg total) by mouth every morning. 30 capsule 0   Multiple Vitamin (MULTIVITAMIN WITH MINERALS) TABS tablet Take 1 tablet by mouth daily.     No current facility-administered medications for this visit.    Medication Side Effects: None  Allergies:  Allergies  Allergen Reactions   Codeine Nausea And Vomiting    Past Medical History:  Diagnosis Date   Headache     Past Medical History, Surgical history, Social history, and Family history were reviewed and updated as appropriate.   Please see review of systems for further details on the patient's review from today.   Objective:   Physical Exam:  There were no vitals taken for this visit.  Physical Exam Constitutional:      General: He is not in acute distress. Musculoskeletal:        General: No deformity.  Neurological:     Mental Status: He is alert and oriented to person, place, and time.     Coordination: Coordination normal.  Psychiatric:        Attention and Perception: Attention and perception normal. He does not perceive auditory or visual hallucinations.        Mood and Affect: Mood normal. Mood is not anxious or depressed. Affect is not labile, blunt, angry or inappropriate.        Speech: Speech normal.        Behavior: Behavior  normal.        Thought Content: Thought content normal. Thought content is not paranoid or delusional. Thought content does not include homicidal or suicidal ideation. Thought content does not include homicidal or suicidal plan.        Cognition and Memory: Cognition and memory normal.        Judgment: Judgment normal.     Comments: Insight intact     Lab Review:     Component Value Date/Time   NA 135 08/11/2017 0222   K 4.5 08/11/2017 0222   CL 105 08/11/2017 0222   CO2 21 (L) 08/11/2017 0222   GLUCOSE 126  (H) 08/11/2017 0222   BUN 13 08/11/2017 0222   CREATININE 0.77 08/11/2017 0222   CALCIUM 8.9 08/11/2017 0222   PROT 7.0 08/08/2017 0023   ALBUMIN 4.2 08/08/2017 0023   AST 20 08/08/2017 0023   ALT 16 (L) 08/08/2017 0023   ALKPHOS 51 08/08/2017 0023   BILITOT 0.7 08/08/2017 0023   GFRNONAA >60 08/11/2017 0222   GFRAA >60 08/11/2017 0222       Component Value Date/Time   WBC 11.0 (H) 08/11/2017 0434   RBC 4.46 08/11/2017 0434   HGB 12.8 (L) 08/11/2017 0434   HCT 38.7 (L) 08/11/2017 0434   PLT 182 08/11/2017 0434   MCV 86.8 08/11/2017 0434   MCH 28.7 08/11/2017 0434   MCHC 33.1 08/11/2017 0434   RDW 12.3 08/11/2017 0434   LYMPHSABS 0.6 (L) 08/07/2017 2042   MONOABS 0.1 08/07/2017 2042   EOSABS 0.0 08/07/2017 2042   BASOSABS 0.0 08/07/2017 2042    No results found for: "POCLITH", "LITHIUM"   No results found for: "PHENYTOIN", "PHENOBARB", "VALPROATE", "CBMZ"   .res Assessment: Plan:    Plan:  PDMP reviewed  1. Vyvanse '50mg'$  daily 2. 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  Discussed potential benefits, risks, and side effects of stimulants with patient to include increased heart rate, palpitations, insomnia, increased anxiety, increased irritability, or decreased appetite.  Instructed patient to contact office if experiencing any significant tolerability issues.  There are no diagnoses linked to this encounter.   Please see After Visit Summary for patient specific instructions.  No future appointments.  No orders of the defined types were placed in this encounter.   -------------------------------

## 2022-04-12 ENCOUNTER — Ambulatory Visit (INDEPENDENT_AMBULATORY_CARE_PROVIDER_SITE_OTHER): Payer: BLUE CROSS/BLUE SHIELD | Admitting: Adult Health

## 2022-04-12 DIAGNOSIS — F489 Nonpsychotic mental disorder, unspecified: Secondary | ICD-10-CM

## 2022-04-12 NOTE — Progress Notes (Signed)
Patient no show appointment. ? ?
